# Patient Record
Sex: Female | Born: 1975
Health system: Southern US, Community
[De-identification: ages and names within clinical notes are randomized; demographics above are authoritative.]

## PROBLEM LIST (undated history)

## (undated) DIAGNOSIS — L0291 Cutaneous abscess, unspecified: Secondary | ICD-10-CM

## (undated) DIAGNOSIS — B029 Zoster without complications: Secondary | ICD-10-CM

## (undated) HISTORY — PX: CHOLECYSTECTOMY: SHX55

## (undated) HISTORY — PX: TUBAL LIGATION: SHX77

## (undated) HISTORY — PX: BUNIONECTOMY: SHX129

## (undated) SURGERY — INSERTION, INTRAMEDULLARY ROD, TIBIA
Anesthesia: Choice | Laterality: Right

---

## 2003-11-21 ENCOUNTER — Emergency Department (HOSPITAL_COMMUNITY): Admission: EM | Admit: 2003-11-21 | Discharge: 2003-11-21 | Payer: Self-pay | Admitting: Emergency Medicine

## 2008-05-25 ENCOUNTER — Emergency Department: Payer: Self-pay | Admitting: Emergency Medicine

## 2008-05-29 ENCOUNTER — Emergency Department: Payer: Self-pay | Admitting: Emergency Medicine

## 2009-05-28 ENCOUNTER — Emergency Department (HOSPITAL_COMMUNITY): Admission: EM | Admit: 2009-05-28 | Discharge: 2009-05-28 | Payer: Self-pay | Admitting: Emergency Medicine

## 2010-02-09 ENCOUNTER — Emergency Department (HOSPITAL_BASED_OUTPATIENT_CLINIC_OR_DEPARTMENT_OTHER): Admission: EM | Admit: 2010-02-09 | Discharge: 2010-02-09 | Payer: Self-pay | Admitting: Emergency Medicine

## 2010-02-09 ENCOUNTER — Ambulatory Visit: Payer: Self-pay | Admitting: Diagnostic Radiology

## 2010-03-11 ENCOUNTER — Emergency Department (HOSPITAL_BASED_OUTPATIENT_CLINIC_OR_DEPARTMENT_OTHER): Admission: EM | Admit: 2010-03-11 | Discharge: 2010-03-11 | Payer: Self-pay | Admitting: Emergency Medicine

## 2010-03-11 ENCOUNTER — Ambulatory Visit: Payer: Self-pay | Admitting: Diagnostic Radiology

## 2010-06-28 ENCOUNTER — Emergency Department (HOSPITAL_COMMUNITY)
Admission: EM | Admit: 2010-06-28 | Discharge: 2010-06-28 | Payer: Self-pay | Source: Home / Self Care | Admitting: Emergency Medicine

## 2010-09-16 ENCOUNTER — Emergency Department (HOSPITAL_COMMUNITY)
Admission: EM | Admit: 2010-09-16 | Discharge: 2010-09-16 | Disposition: A | Discharge status: Home / Self Care | Payer: Self-pay | Attending: Emergency Medicine | Admitting: Emergency Medicine

## 2010-09-16 DIAGNOSIS — R51 Headache: Secondary | ICD-10-CM | POA: Insufficient documentation

## 2010-09-16 DIAGNOSIS — J3489 Other specified disorders of nose and nasal sinuses: Secondary | ICD-10-CM | POA: Insufficient documentation

## 2010-09-16 DIAGNOSIS — R05 Cough: Secondary | ICD-10-CM | POA: Insufficient documentation

## 2010-09-16 DIAGNOSIS — J309 Allergic rhinitis, unspecified: Secondary | ICD-10-CM | POA: Insufficient documentation

## 2010-09-16 DIAGNOSIS — R0609 Other forms of dyspnea: Secondary | ICD-10-CM | POA: Insufficient documentation

## 2010-09-16 DIAGNOSIS — R0989 Other specified symptoms and signs involving the circulatory and respiratory systems: Secondary | ICD-10-CM | POA: Insufficient documentation

## 2010-09-16 DIAGNOSIS — R059 Cough, unspecified: Secondary | ICD-10-CM | POA: Insufficient documentation

## 2011-11-19 ENCOUNTER — Emergency Department (HOSPITAL_BASED_OUTPATIENT_CLINIC_OR_DEPARTMENT_OTHER): Payer: Self-pay

## 2011-11-19 ENCOUNTER — Encounter (HOSPITAL_BASED_OUTPATIENT_CLINIC_OR_DEPARTMENT_OTHER): Payer: Self-pay | Admitting: *Deleted

## 2011-11-19 ENCOUNTER — Emergency Department (HOSPITAL_BASED_OUTPATIENT_CLINIC_OR_DEPARTMENT_OTHER)
Admission: EM | Admit: 2011-11-19 | Discharge: 2011-11-19 | Disposition: A | Discharge status: Home / Self Care | Payer: Self-pay | Attending: Emergency Medicine | Admitting: Emergency Medicine

## 2011-11-19 DIAGNOSIS — R0602 Shortness of breath: Secondary | ICD-10-CM | POA: Insufficient documentation

## 2011-11-19 DIAGNOSIS — R635 Abnormal weight gain: Secondary | ICD-10-CM | POA: Insufficient documentation

## 2011-11-19 DIAGNOSIS — R2240 Localized swelling, mass and lump, unspecified lower limb: Secondary | ICD-10-CM

## 2011-11-19 DIAGNOSIS — R229 Localized swelling, mass and lump, unspecified: Secondary | ICD-10-CM | POA: Insufficient documentation

## 2011-11-19 LAB — URINALYSIS, ROUTINE W REFLEX MICROSCOPIC
Bilirubin Urine: NEGATIVE
Glucose, UA: NEGATIVE mg/dL
Leukocytes, UA: NEGATIVE
Nitrite: NEGATIVE
Protein, ur: NEGATIVE mg/dL
Urobilinogen, UA: 0.2 mg/dL (ref 0.0–1.0)
pH: 5.5 (ref 5.0–8.0)

## 2011-11-19 LAB — CBC
HCT: 38.1 % (ref 36.0–46.0)
MCH: 30.3 pg (ref 26.0–34.0)
MCHC: 34.4 g/dL (ref 30.0–36.0)
MCV: 88 fL (ref 78.0–100.0)
Platelets: 380 10*3/uL (ref 150–400)
RBC: 4.33 MIL/uL (ref 3.87–5.11)
RDW: 12.9 % (ref 11.5–15.5)
WBC: 13.5 10*3/uL — ABNORMAL HIGH (ref 4.0–10.5)

## 2011-11-19 LAB — COMPREHENSIVE METABOLIC PANEL
Calcium: 9.5 mg/dL (ref 8.4–10.5)
Creatinine, Ser: 0.8 mg/dL (ref 0.50–1.10)
GFR calc non Af Amer: >90 mL/min (ref >90)
Glucose, Bld: 108 mg/dL — ABNORMAL HIGH (ref 70–99)
Total Bilirubin: 0.2 mg/dL — ABNORMAL LOW (ref 0.3–1.2)

## 2011-11-19 LAB — URINE MICROSCOPIC-ADD ON

## 2011-11-19 LAB — PREGNANCY, URINE: Preg Test, Ur: NEGATIVE

## 2011-11-19 NOTE — ED Notes (Signed)
Pt c/o bil lower leg swelling.

## 2011-11-19 NOTE — ED Provider Notes (Signed)
History     CSN: [id number]  Arrival date & time 11/19/11  1525   First MD Initiated Contact with Patient 11/19/11 1535      Chief Complaint  Patient presents with  . Leg Swelling    (Consider location/radiation/quality/duration/timing/severity/associated sxs/prior treatment) HPI Comments: Pt states that she has had bilateral lower leg swelling time a couple of weeks:pt states that the swelling come and goes:pt states that the left is worse then the right:pt c/o left foot pain without known injury:pt states that she may have some intermittent sob:pt denies any medical problems:pt states that she has had a 12lb wt gain in the last month  The history is provided by the patient. No language interpreter was used.    History reviewed. No pertinent past medical history.  Past Surgical History  Procedure Date  . Tubal ligation     History reviewed. No pertinent family history.  History  Substance Use Topics  . Smoking status: Never Smoker   . Smokeless tobacco: Not on file  . Alcohol Use: No    OB History    Grav Para Term Preterm Abortions TAB SAB Ect Mult Living                  Review of Systems  Constitutional: Negative.   Respiratory: Positive for shortness of breath.   Cardiovascular: Negative.   Neurological: Negative.     Allergies  Review of patient's allergies indicates no known allergies.  Home Medications  No current outpatient prescriptions on file.  BP 141/91  Pulse 78  Temp(Src) 98.8 F (37.1 C) (Oral)  Resp 16  Ht 5\' 5"  (1.651 m)  Wt 234 lb (106.142 kg)  BMI 38.94 kg/m2  SpO2 100%  LMP 11/19/2011  Physical Exam  Nursing note and vitals reviewed. Constitutional: She is oriented to person, place, and time. She appears well-developed and well-nourished.  HENT:  Head: Normocephalic and atraumatic.  Eyes: Conjunctivae are normal.  Cardiovascular: Normal rate and regular rhythm.   Pulmonary/Chest: Effort normal and breath sounds normal.    Abdominal: Soft. Bowel sounds are normal. There is no tenderness.  Musculoskeletal: Normal range of motion. She exhibits no edema.  Neurological: She is alert and oriented to person, place, and time. She exhibits normal muscle tone. Coordination normal.  Skin: Skin is warm and dry.    ED Course  Procedures (including critical care time)  Labs Reviewed  COMPREHENSIVE METABOLIC PANEL - Abnormal; Notable for the following:    Glucose, Bld 108 (*)    Total Bilirubin 0.2 (*)    All other components within normal limits  CBC - Abnormal; Notable for the following:    WBC 13.5 (*)    All other components within normal limits  URINALYSIS, ROUTINE W REFLEX MICROSCOPIC - Abnormal; Notable for the following:    Hgb urine dipstick TRACE (*)    All other components within normal limits  PREGNANCY, URINE  URINE MICROSCOPIC-ADD ON   Dg Chest 2 View  11/19/2011  *RADIOLOGY REPORT*  Clinical Data: Bilateral foot swelling.  Mild shortness of breath.  CHEST - 2 VIEW  Comparison: None.  Findings: Lungs are clear.  Heart size is normal.  No pneumothorax or effusion.  IMPRESSION: No acute finding.  Original Report Authenticated By: Bernadene Bell. D'ALESSIO, M.D.   Dg Foot Complete Left  11/19/2011  *RADIOLOGY REPORT*  Clinical Data: Swelling, pain  LEFT FOOT - COMPLETE 3+ VIEW  Comparison: None.  Findings: Normal alignment without evidence of acute fracture.  Preserved joint spaces.  No significant arthropathy.  No radiographic swelling or foreign body.  IMPRESSION: No acute finding.  Original Report Authenticated By: Judie Petit. Ruel Favors, M.D.     1. Weight gain   2. Localized swelling, mass, or lump of lower extremity       MDM  No edema noted at this time:pt requesting lasix:discussed with pt that that is not appropriate treatment at this time:discussed pt being rechecked with the pt        Teressa Lower, NP 11/19/11 1740

## 2011-11-19 NOTE — ED Provider Notes (Signed)
Medical screening examination/treatment/procedure(s) were performed by non-physician practitioner and as supervising physician I was immediately available for consultation/collaboration.    Nelia Shi, MD 11/19/11 [phone]

## 2012-03-30 ENCOUNTER — Encounter (HOSPITAL_BASED_OUTPATIENT_CLINIC_OR_DEPARTMENT_OTHER): Payer: Self-pay | Admitting: Emergency Medicine

## 2012-03-30 ENCOUNTER — Emergency Department (HOSPITAL_BASED_OUTPATIENT_CLINIC_OR_DEPARTMENT_OTHER)
Admission: EM | Admit: 2012-03-30 | Discharge: 2012-03-30 | Disposition: A | Discharge status: Home / Self Care | Payer: Self-pay | Attending: Emergency Medicine | Admitting: Emergency Medicine

## 2012-03-30 DIAGNOSIS — B029 Zoster without complications: Secondary | ICD-10-CM | POA: Insufficient documentation

## 2012-03-30 MED ORDER — ACYCLOVIR 400 MG PO TABS: 800.0000 mg | ORAL_TABLET | Freq: Every day | ORAL | Status: DC

## 2012-03-30 MED ORDER — OXYCODONE-ACETAMINOPHEN 5-325 MG PO TABS: 1.0000 | ORAL_TABLET | Freq: Four times a day (QID) | ORAL | Status: DC | PRN

## 2012-03-30 MED ORDER — PREDNISONE 10 MG PO TABS: 20.0000 mg | ORAL_TABLET | Freq: Two times a day (BID) | ORAL | Status: DC

## 2012-03-30 NOTE — ED Notes (Signed)
Painful rash to left lateral breast.  Has gotten worse with pain and itching.

## 2012-03-30 NOTE — ED Provider Notes (Signed)
History  This chart was scribed for [name] Lyons, MD by Erskine Emery. This patient was seen in room MH12/MH12 and the patient's care was started at 22:19.   CSN: [id number]  Arrival date & time 03/30/12  2156   First MD Initiated Contact with Patient 03/30/12 2219      Chief Complaint  Patient presents with  . Rash    (Consider location/radiation/quality/duration/timing/severity/associated sxs/prior treatment) Patient is a 36 y.o. female presenting with rash. The history is provided by the patient. No language interpreter was used.  Rash  The problem is associated with nothing. There has been no fever. Affected Location: left chest. The pain is moderate. The pain has been constant since onset. Associated symptoms include blisters and pain. She has tried nothing for the symptoms.  Mandie Crabbe is a 36 y.o. female who presents to the Emergency Department complaining of a painful rash to the left lateral breast since Friday night. Pt reports Vicodin makes her sick; percocet is the only pain medication she knows she tolerates well.  History reviewed. No pertinent past medical history.  Past Surgical History  Procedure Date  . Tubal ligation     No family history on file.  History  Substance Use Topics  . Smoking status: Never Smoker   . Smokeless tobacco: Not on file  . Alcohol Use: No    OB History    Grav Para Term Preterm Abortions TAB SAB Ect Mult Living                  Review of Systems  Constitutional: Negative for fever and chills.  Respiratory: Negative for shortness of breath.   Gastrointestinal: Negative for nausea and vomiting.  Skin: Positive for rash.  Neurological: Negative for weakness.    Allergies  Review of patient's allergies indicates no known allergies.  Home Medications   Current Outpatient Rx  Name Route Sig Dispense Refill  . BC HEADACHE POWDER PO Oral Take 1 packet by mouth daily as needed. Patient used this medication for her back  pain.      Triage Virals: BP 127/85  Pulse 82  Temp 98.4 F (36.9 C) (Oral)  Resp 16  Ht 5\' 5"  (1.651 m)  Wt 230 lb (104.327 kg)  BMI 38.27 kg/m2  SpO2 100%  LMP 02/27/2012  Physical Exam  Nursing note and vitals reviewed. Constitutional: She is oriented to person, place, and time. She appears well-developed and well-nourished. No distress.  HENT:  Head: Normocephalic and atraumatic.  Eyes: EOM are normal.  Neck: Neck supple. No tracheal deviation present.  Cardiovascular: Normal rate.   Pulmonary/Chest: Effort normal. No respiratory distress.  Abdominal: Soft. She exhibits no distension.  Musculoskeletal: Normal range of motion. She exhibits no edema.  Neurological: She is alert and oriented to person, place, and time.  Skin: Skin is warm and dry.       Just below the left axilla: a vesicular rash in a dermatomal pattern.  Psychiatric: She has a normal mood and affect.    ED Course  Procedures (including critical care time) DIAGNOSTIC STUDIES: Oxygen Saturation is 100% on room air, normal by my interpretation.    COORDINATION OF CARE: 22:25--I evaluated the patient and we discussed a treatment plan including acyclovir, prednisone, and percocet to which the pt agreed. I told the pt to stay away from anyone who is immunocompromised or anyone pregnant and to keep the area covered. I told the pt it may be about a week-2 weeks before the  area heals over.   Labs Reviewed - No data to display No results found.   No diagnosis found.    MDM  This is a shingles, will treat with acyclovir, prednisone, and hydrocodone.  To return prn.      I personally performed the services described in this documentation, which was scribed in my presence. The recorded information has been reviewed and considered.     [name] Lyons, MD 04/01/12 [phone]
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
## 2012-08-01 ENCOUNTER — Encounter (HOSPITAL_BASED_OUTPATIENT_CLINIC_OR_DEPARTMENT_OTHER): Payer: Self-pay

## 2012-08-01 ENCOUNTER — Emergency Department (HOSPITAL_BASED_OUTPATIENT_CLINIC_OR_DEPARTMENT_OTHER)
Admission: EM | Admit: 2012-08-01 | Discharge: 2012-08-01 | Disposition: A | Discharge status: Home / Self Care | Payer: Self-pay | Attending: Emergency Medicine | Admitting: Emergency Medicine

## 2012-08-01 ENCOUNTER — Emergency Department (HOSPITAL_BASED_OUTPATIENT_CLINIC_OR_DEPARTMENT_OTHER): Payer: Self-pay

## 2012-08-01 DIAGNOSIS — R0602 Shortness of breath: Secondary | ICD-10-CM | POA: Insufficient documentation

## 2012-08-01 DIAGNOSIS — J189 Pneumonia, unspecified organism: Secondary | ICD-10-CM

## 2012-08-01 DIAGNOSIS — Z7982 Long term (current) use of aspirin: Secondary | ICD-10-CM | POA: Insufficient documentation

## 2012-08-01 DIAGNOSIS — Z792 Long term (current) use of antibiotics: Secondary | ICD-10-CM | POA: Insufficient documentation

## 2012-08-01 DIAGNOSIS — Z79899 Other long term (current) drug therapy: Secondary | ICD-10-CM | POA: Insufficient documentation

## 2012-08-01 DIAGNOSIS — R498 Other voice and resonance disorders: Secondary | ICD-10-CM | POA: Insufficient documentation

## 2012-08-01 DIAGNOSIS — B029 Zoster without complications: Secondary | ICD-10-CM | POA: Insufficient documentation

## 2012-08-01 DIAGNOSIS — J3489 Other specified disorders of nose and nasal sinuses: Secondary | ICD-10-CM | POA: Insufficient documentation

## 2012-08-01 DIAGNOSIS — R0982 Postnasal drip: Secondary | ICD-10-CM | POA: Insufficient documentation

## 2012-08-01 DIAGNOSIS — J159 Unspecified bacterial pneumonia: Secondary | ICD-10-CM | POA: Insufficient documentation

## 2012-08-01 HISTORY — DX: Zoster without complications: B02.9

## 2012-08-01 MED ORDER — AZITHROMYCIN 250 MG PO TABS: 250.0000 mg | ORAL_TABLET | Freq: Every day | ORAL | Status: DC

## 2012-08-01 MED ORDER — ALBUTEROL SULFATE (5 MG/ML) 0.5% IN NEBU
5.0000 mg | INHALATION_SOLUTION | Freq: Once | RESPIRATORY_TRACT | Status: AC
  Administered 2012-08-01: 5 mg via RESPIRATORY_TRACT
  Filled 2012-08-01: qty 1

## 2012-08-01 MED ORDER — AZITHROMYCIN 250 MG PO TABS
500.0000 mg | ORAL_TABLET | Freq: Once | ORAL | Status: AC
  Administered 2012-08-01: 500 mg via ORAL
  Filled 2012-08-01: qty 2

## 2012-08-01 NOTE — ED Provider Notes (Signed)
History  This chart was scribed for Charles B. Bernette Mayers, MD by Shari Heritage, ED Scribe. The patient was seen in room MH10/MH10. Patient's care was started at 1905.   CSN: [id number]  Arrival date & time 08/01/12  1734   First MD Initiated Contact with Patient 08/01/12 1905      Chief Complaint  Patient presents with  . Cough  . Shortness of Breath    The history is provided by the patient. No language interpreter was used.    HPI Comments: [name] [name] is a 37 y.o. female who presents to the Emergency Department complaining moderate shortness of breath onset 2.5 hours ago and cough that began a few days ago. Patient states that cough is sometimes productive of green sputum. There is associated nasal congestion, postnasal drip and voice hoarseness. Patient states that hoarseness also began today when she arrived in the ED. Patient denies fever or sore throat. Patient states that a couple years ago, she had similar shortness of breath and voice hoarseness and was given a breathing treatment at [facility] which improved symptoms significantly. Patient has a medical history of shingles and surgical history of tubal ligation.  Past Medical History  Diagnosis Date  . Shingles     Past Surgical History  Procedure Laterality Date  . Tubal ligation      History reviewed. No pertinent family history.  History  Substance Use Topics  . Smoking status: Passive Smoke Exposure - Never Smoker  . Smokeless tobacco: Never Used  . Alcohol Use: Yes     Comment: occasional use    OB History   Grav Para Term Preterm Abortions TAB SAB Ect Mult Living                  Review of Systems A complete 10 system review of systems was obtained and all systems are negative except as noted in the HPI and PMH.   Allergies  Review of patient's allergies indicates no known allergies.  Home Medications   Current Outpatient Rx  Name  Route  Sig  Dispense  Refill  . Aspirin-Salicylamide-Caffeine (BC  HEADACHE POWDER PO)   Oral   Take 1 packet by mouth daily as needed. Patient used this medication for her back pain.         Marland Kitchen acyclovir (ZOVIRAX) 400 MG tablet   Oral   Take 2 tablets (800 mg total) by mouth 5 (five) times daily.   100 tablet   0   . oxyCODONE-acetaminophen (PERCOCET/ROXICET) 5-325 MG per tablet   Oral   Take 1-2 tablets by mouth every 6 (six) hours as needed for pain.   20 tablet   0   . predniSONE (DELTASONE) 10 MG tablet   Oral   Take 2 tablets (20 mg total) by mouth 2 (two) times daily.   20 tablet   0     Triage Vitals: BP 135/85  Pulse 87  Temp(Src) 98.1 F (36.7 C) (Oral)  Resp 18  Ht 5\' 5"  (1.651 m)  Wt 230 lb (104.327 kg)  BMI 38.27 kg/m2  SpO2 100%  LMP 07/01/2012  Physical Exam  Nursing note and vitals reviewed. Constitutional: She is oriented to person, place, and time. She appears well-developed and well-nourished.  HENT:  Head: Normocephalic and atraumatic.  Hoarse voice.  Eyes: EOM are normal. Pupils are equal, round, and reactive to light.  Neck: Normal range of motion. Neck supple.  Cardiovascular: Normal rate, normal heart sounds and intact distal pulses.  Pulmonary/Chest: Effort normal. No stridor. She has wheezes (Faint expiratory wheeze.).  Abdominal: Bowel sounds are normal. She exhibits no distension. There is no tenderness.  Musculoskeletal: Normal range of motion. She exhibits no edema and no tenderness.  Neurological: She is alert and oriented to person, place, and time. She has normal strength. No cranial nerve deficit or sensory deficit.  Skin: Skin is warm and dry. No rash noted.  Psychiatric: She has a normal mood and affect.    ED Course  Procedures (including critical care time) DIAGNOSTIC STUDIES: Oxygen Saturation is 100% on room air, normal by my interpretation.    COORDINATION OF CARE: 7:06 PM- Patient informed of current plan for treatment and evaluation and agrees with plan at this time.    Labs  Reviewed - No data to display   Dg Chest 2 View  08/01/2012  *RADIOLOGY REPORT*  Clinical Data: Cough  CHEST - 2 VIEW  Comparison: 11/19/2011  Findings: Heart size is normal.  No pleural effusion identified. There is marked asymmetric elevation of the right hemidiaphragm. Overlying airspace consolidation is noted.  Left lung appears clear.  Review of the visualized osseous structures is unremarkable.  IMPRESSION:  1.  Right lower lobe airspace consolidation consistent with pneumonia. 2.  Marked asymmetric elevation of the right hemidiaphragm.   Original Report Authenticated By: Signa Kell, M.D.      No diagnosis found.    MDM  Hoarse voice resolved with Neb treatment. She is feeling better. CXR as above, concerning for developing pneumonia. She is afebrile without hypoxia here. Will treat with Z-pak. PCP follow up for resolution.      I personally performed the services described in this documentation, which was scribed in my presence. The recorded information has been reviewed and is accurate.     Charles B. Bernette Mayers, MD 08/01/12 [phone]

## 2012-08-01 NOTE — ED Notes (Signed)
Patient transported to X-ray 

## 2012-08-01 NOTE — ED Notes (Signed)
Pt states that she has had cough for a few days, productive for green sputum.  Pt states that she has sob, and hoarseness of voice for the past few hours.  Pt is anxious and has tachypnea at triage.

## 2012-12-31 ENCOUNTER — Encounter (HOSPITAL_BASED_OUTPATIENT_CLINIC_OR_DEPARTMENT_OTHER): Payer: Self-pay

## 2012-12-31 ENCOUNTER — Emergency Department (HOSPITAL_BASED_OUTPATIENT_CLINIC_OR_DEPARTMENT_OTHER)
Admission: EM | Admit: 2012-12-31 | Discharge: 2012-12-31 | Disposition: A | Discharge status: Home / Self Care | Payer: Self-pay | Attending: Emergency Medicine | Admitting: Emergency Medicine

## 2012-12-31 DIAGNOSIS — Z8619 Personal history of other infectious and parasitic diseases: Secondary | ICD-10-CM | POA: Insufficient documentation

## 2012-12-31 DIAGNOSIS — N760 Acute vaginitis: Secondary | ICD-10-CM | POA: Insufficient documentation

## 2012-12-31 DIAGNOSIS — Z3202 Encounter for pregnancy test, result negative: Secondary | ICD-10-CM | POA: Insufficient documentation

## 2012-12-31 LAB — GC/CHLAMYDIA PROBE AMP: CT Probe RNA: NEGATIVE

## 2012-12-31 LAB — WET PREP, GENITAL

## 2012-12-31 LAB — URINALYSIS, ROUTINE W REFLEX MICROSCOPIC
Bilirubin Urine: NEGATIVE
Urobilinogen, UA: 0.2 mg/dL (ref 0.0–1.0)

## 2012-12-31 LAB — URINE MICROSCOPIC-ADD ON

## 2012-12-31 MED ORDER — METRONIDAZOLE 500 MG PO TABS
500.0000 mg | ORAL_TABLET | Freq: Once | ORAL | Status: AC
  Administered 2012-12-31: 500 mg via ORAL
  Filled 2012-12-31: qty 1

## 2012-12-31 MED ORDER — FLUCONAZOLE 50 MG PO TABS
ORAL_TABLET | ORAL | Status: AC
  Filled 2012-12-31: qty 1

## 2012-12-31 MED ORDER — METRONIDAZOLE 500 MG PO TABS: 500.0000 mg | ORAL_TABLET | Freq: Two times a day (BID) | ORAL | Status: DC

## 2012-12-31 MED ORDER — FLUCONAZOLE 50 MG PO TABS
150.0000 mg | ORAL_TABLET | Freq: Every day | ORAL | Status: DC
  Administered 2012-12-31: 150 mg via ORAL

## 2012-12-31 MED ORDER — FLUCONAZOLE 100 MG PO TABS
ORAL_TABLET | ORAL | Status: AC
  Filled 2012-12-31: qty 1
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
## 2012-12-31 NOTE — ED Notes (Signed)
Pt complains of yeast infection symptoms that started late Monday night early Tuesday morning.  Pt reports being allergic to OTC medications.

## 2013-01-01 LAB — URINE CULTURE: Colony Count: 50000

## 2013-07-13 ENCOUNTER — Encounter (HOSPITAL_COMMUNITY): Payer: Self-pay | Admitting: Emergency Medicine

## 2013-07-13 ENCOUNTER — Emergency Department (HOSPITAL_COMMUNITY): Payer: Medicaid Other | Admitting: Certified Registered Nurse Anesthetist

## 2013-07-13 ENCOUNTER — Emergency Department (HOSPITAL_COMMUNITY): Payer: Medicaid Other

## 2013-07-13 ENCOUNTER — Observation Stay (HOSPITAL_COMMUNITY)
Admission: EM | Admit: 2013-07-13 | Discharge: 2013-07-14 | Disposition: A | Discharge status: Home / Self Care | Payer: Medicaid Other | Attending: Emergency Medicine | Admitting: Emergency Medicine

## 2013-07-13 ENCOUNTER — Encounter (HOSPITAL_COMMUNITY): Payer: Medicaid Other | Admitting: Certified Registered Nurse Anesthetist

## 2013-07-13 ENCOUNTER — Encounter (HOSPITAL_COMMUNITY)
Admission: EM | Disposition: A | Discharge status: Home / Self Care | Payer: Self-pay | Source: Home / Self Care | Attending: Emergency Medicine

## 2013-07-13 DIAGNOSIS — S82209A Unspecified fracture of shaft of unspecified tibia, initial encounter for closed fracture: Principal | ICD-10-CM | POA: Insufficient documentation

## 2013-07-13 DIAGNOSIS — S82401A Unspecified fracture of shaft of right fibula, initial encounter for closed fracture: Secondary | ICD-10-CM

## 2013-07-13 DIAGNOSIS — K219 Gastro-esophageal reflux disease without esophagitis: Secondary | ICD-10-CM | POA: Insufficient documentation

## 2013-07-13 DIAGNOSIS — S82201A Unspecified fracture of shaft of right tibia, initial encounter for closed fracture: Secondary | ICD-10-CM | POA: Diagnosis present

## 2013-07-13 DIAGNOSIS — S82409A Unspecified fracture of shaft of unspecified fibula, initial encounter for closed fracture: Principal | ICD-10-CM

## 2013-07-13 HISTORY — PX: TIBIA IM NAIL INSERTION: SHX2516

## 2013-07-13 HISTORY — PX: ORIF TIBIA FRACTURE: SHX5416

## 2013-07-13 LAB — CBC WITH DIFFERENTIAL/PLATELET
BASOS ABS: 0.2 10*3/uL — AB (ref 0.0–0.1)
BASOS PCT: 1 % (ref 0–1)
Eosinophils Absolute: 0.4 10*3/uL (ref 0.0–0.7)
Eosinophils Relative: 2 % (ref 0–5)
HCT: 40 % (ref 36.0–46.0)
Hemoglobin: 13.4 g/dL (ref 12.0–15.0)
LYMPHS PCT: 28 % (ref 12–46)
Lymphs Abs: 4 10*3/uL (ref 0.7–4.0)
MCH: 30.5 pg (ref 26.0–34.0)
MCHC: 33.5 g/dL (ref 30.0–36.0)
MCV: 90.9 fL (ref 78.0–100.0)
Monocytes Absolute: 0.9 10*3/uL (ref 0.1–1.0)
Monocytes Relative: 6 % (ref 3–12)
NEUTROS ABS: 9.1 10*3/uL — AB (ref 1.7–7.7)
NEUTROS PCT: 63 % (ref 43–77)
Platelets: 316 10*3/uL (ref 150–400)
RBC: 4.4 MIL/uL (ref 3.87–5.11)
RDW: 13.3 % (ref 11.5–15.5)
WBC: 14.5 10*3/uL — ABNORMAL HIGH (ref 4.0–10.5)

## 2013-07-13 SURGERY — INSERTION, INTRAMEDULLARY ROD, TIBIA
Anesthesia: General | Site: Leg Lower | Laterality: Right

## 2013-07-13 MED ORDER — HYDROMORPHONE HCL PF 1 MG/ML IJ SOLN
1.0000 mg | INTRAMUSCULAR | Status: DC | PRN
Start: 2013-07-13 — End: 2013-07-13
  Filled 2013-07-13: qty 1

## 2013-07-13 MED ORDER — ONDANSETRON HCL 4 MG PO TABS: 4.0000 mg | ORAL_TABLET | Freq: Four times a day (QID) | ORAL | Status: DC | PRN

## 2013-07-13 MED ORDER — MIDAZOLAM HCL 2 MG/2ML IJ SOLN
INTRAMUSCULAR | Status: AC
  Filled 2013-07-13: qty 2

## 2013-07-13 MED ORDER — CEFAZOLIN SODIUM-DEXTROSE 2-3 GM-% IV SOLR
INTRAVENOUS | Status: DC | PRN
  Administered 2013-07-13: 2 g via INTRAVENOUS

## 2013-07-13 MED ORDER — PROPOFOL 10 MG/ML IV BOLUS
INTRAVENOUS | Status: DC | PRN
Start: 2013-07-13 — End: 2013-07-13
  Administered 2013-07-13: 170 mg via INTRAVENOUS

## 2013-07-13 MED ORDER — DOCUSATE SODIUM 100 MG PO CAPS
100.0000 mg | ORAL_CAPSULE | Freq: Two times a day (BID) | ORAL | Status: DC
  Administered 2013-07-14: 100 mg via ORAL
  Filled 2013-07-13 (×2): qty 1

## 2013-07-13 MED ORDER — ASPIRIN EC 325 MG PO TBEC
325.0000 mg | DELAYED_RELEASE_TABLET | Freq: Two times a day (BID) | ORAL | Status: DC
  Administered 2013-07-14: 325 mg via ORAL
  Filled 2013-07-13 (×3): qty 1

## 2013-07-13 MED ORDER — ONDANSETRON HCL 4 MG/2ML IJ SOLN
INTRAMUSCULAR | Status: AC
  Filled 2013-07-13: qty 2

## 2013-07-13 MED ORDER — MIDAZOLAM HCL 5 MG/5ML IJ SOLN
INTRAMUSCULAR | Status: DC | PRN
  Administered 2013-07-13 (×2): 1 mg via INTRAVENOUS

## 2013-07-13 MED ORDER — HYDROMORPHONE HCL PF 1 MG/ML IJ SOLN
INTRAMUSCULAR | Status: AC
  Filled 2013-07-13: qty 1

## 2013-07-13 MED ORDER — ONDANSETRON HCL 4 MG/2ML IJ SOLN
INTRAMUSCULAR | Status: DC | PRN
Start: 2013-07-13 — End: 2013-07-13
  Administered 2013-07-13: 4 mg via INTRAVENOUS

## 2013-07-13 MED ORDER — LIDOCAINE HCL (CARDIAC) 20 MG/ML IV SOLN
INTRAVENOUS | Status: AC
  Filled 2013-07-13: qty 5

## 2013-07-13 MED ORDER — SUCCINYLCHOLINE CHLORIDE 20 MG/ML IJ SOLN
INTRAMUSCULAR | Status: AC
  Filled 2013-07-13: qty 1

## 2013-07-13 MED ORDER — DEXAMETHASONE SODIUM PHOSPHATE 4 MG/ML IJ SOLN
INTRAMUSCULAR | Status: DC | PRN
  Administered 2013-07-13: 4 mg via INTRAVENOUS

## 2013-07-13 MED ORDER — GLYCOPYRROLATE 0.2 MG/ML IJ SOLN
INTRAMUSCULAR | Status: DC | PRN
  Administered 2013-07-13: 0.4 mg via INTRAVENOUS

## 2013-07-13 MED ORDER — SUCCINYLCHOLINE CHLORIDE 20 MG/ML IJ SOLN
INTRAMUSCULAR | Status: DC | PRN
  Administered 2013-07-13: 100 mg via INTRAVENOUS

## 2013-07-13 MED ORDER — 0.9 % SODIUM CHLORIDE (POUR BTL) OPTIME
TOPICAL | Status: DC | PRN
  Administered 2013-07-13: 1000 mL

## 2013-07-13 MED ORDER — FENTANYL CITRATE 0.05 MG/ML IJ SOLN
INTRAMUSCULAR | Status: DC | PRN
  Administered 2013-07-13 (×6): 50 ug via INTRAVENOUS

## 2013-07-13 MED ORDER — POLYETHYLENE GLYCOL 3350 17 G PO PACK: 17.0000 g | PACK | Freq: Every day | ORAL | Status: DC | PRN

## 2013-07-13 MED ORDER — OXYCODONE-ACETAMINOPHEN 5-325 MG PO TABS
ORAL_TABLET | ORAL | Status: AC
  Filled 2013-07-13: qty 1

## 2013-07-13 MED ORDER — ROCURONIUM BROMIDE 50 MG/5ML IV SOLN
INTRAVENOUS | Status: AC
  Filled 2013-07-13: qty 1

## 2013-07-13 MED ORDER — BISACODYL 10 MG RE SUPP: 10.0000 mg | Freq: Every day | RECTAL | Status: DC | PRN

## 2013-07-13 MED ORDER — PROPOFOL 10 MG/ML IV BOLUS
INTRAVENOUS | Status: AC
  Filled 2013-07-13: qty 20

## 2013-07-13 MED ORDER — OXYCODONE-ACETAMINOPHEN 5-325 MG PO TABS
1.0000 | ORAL_TABLET | ORAL | Status: DC | PRN
  Administered 2013-07-13 – 2013-07-14 (×5): 2 via ORAL
  Filled 2013-07-13 (×5): qty 2

## 2013-07-13 MED ORDER — HYDROMORPHONE HCL PF 1 MG/ML IJ SOLN
1.0000 mg | Freq: Once | INTRAMUSCULAR | Status: AC
  Administered 2013-07-13: 1 mg via INTRAVENOUS
  Filled 2013-07-13: qty 1

## 2013-07-13 MED ORDER — CEFAZOLIN SODIUM-DEXTROSE 2-3 GM-% IV SOLR
2.0000 g | Freq: Four times a day (QID) | INTRAVENOUS | Status: AC
  Administered 2013-07-14 (×3): 2 g via INTRAVENOUS
  Filled 2013-07-13 (×3): qty 50

## 2013-07-13 MED ORDER — METHOCARBAMOL 500 MG PO TABS
500.0000 mg | ORAL_TABLET | Freq: Four times a day (QID) | ORAL | Status: DC | PRN
  Administered 2013-07-13 – 2013-07-14 (×4): 500 mg via ORAL
  Filled 2013-07-13 (×3): qty 1

## 2013-07-13 MED ORDER — METHOCARBAMOL 500 MG PO TABS
ORAL_TABLET | ORAL | Status: AC
  Filled 2013-07-13: qty 1

## 2013-07-13 MED ORDER — SODIUM CHLORIDE 0.9 % IV SOLN
INTRAVENOUS | Status: DC
  Administered 2013-07-13: 22:00:00 via INTRAVENOUS

## 2013-07-13 MED ORDER — CHLORHEXIDINE GLUCONATE CLOTH 2 % EX PADS: 6.0000 | MEDICATED_PAD | Freq: Once | CUTANEOUS | Status: DC

## 2013-07-13 MED ORDER — LACTATED RINGERS IV SOLN
INTRAVENOUS | Status: DC | PRN
  Administered 2013-07-13 (×2): via INTRAVENOUS

## 2013-07-13 MED ORDER — OXYCODONE-ACETAMINOPHEN 5-325 MG PO TABS
ORAL_TABLET | ORAL | Status: AC
  Filled 2013-07-13: qty 2

## 2013-07-13 MED ORDER — CHLORHEXIDINE GLUCONATE 4 % EX LIQD: 60.0000 mL | Freq: Once | CUTANEOUS | Status: DC

## 2013-07-13 MED ORDER — CEFAZOLIN SODIUM-DEXTROSE 2-3 GM-% IV SOLR
INTRAVENOUS | Status: AC
Start: 2013-07-13 — End: 2013-07-13
  Filled 2013-07-13: qty 50

## 2013-07-13 MED ORDER — ROCURONIUM BROMIDE 100 MG/10ML IV SOLN
INTRAVENOUS | Status: DC | PRN
  Administered 2013-07-13: 10 mg via INTRAVENOUS
  Administered 2013-07-13: 30 mg via INTRAVENOUS

## 2013-07-13 MED ORDER — PROMETHAZINE HCL 25 MG/ML IJ SOLN
12.5000 mg | Freq: Four times a day (QID) | INTRAMUSCULAR | Status: DC | PRN
  Administered 2013-07-14: 12.5 mg via INTRAVENOUS
  Filled 2013-07-13 (×2): qty 1

## 2013-07-13 MED ORDER — IOHEXOL 300 MG/ML  SOLN
100.0000 mL | Freq: Once | INTRAMUSCULAR | Status: AC | PRN
  Administered 2013-07-13: 100 mL via INTRAVENOUS

## 2013-07-13 MED ORDER — GLYCOPYRROLATE 0.2 MG/ML IJ SOLN
INTRAMUSCULAR | Status: AC
  Filled 2013-07-13: qty 3

## 2013-07-13 MED ORDER — LACTATED RINGERS IV SOLN
INTRAVENOUS | Status: DC
Start: 2013-07-13 — End: 2013-07-13

## 2013-07-13 MED ORDER — HYDROMORPHONE HCL PF 1 MG/ML IJ SOLN
0.2500 mg | INTRAMUSCULAR | Status: DC | PRN
  Administered 2013-07-13 (×4): 0.5 mg via INTRAVENOUS

## 2013-07-13 MED ORDER — LIDOCAINE HCL (CARDIAC) 20 MG/ML IV SOLN
INTRAVENOUS | Status: DC | PRN
  Administered 2013-07-13: 60 mg via INTRAVENOUS

## 2013-07-13 MED ORDER — HYDROMORPHONE HCL PF 1 MG/ML IJ SOLN
1.0000 mg | INTRAMUSCULAR | Status: DC | PRN
  Administered 2013-07-13 – 2013-07-14 (×3): 1 mg via INTRAVENOUS
  Filled 2013-07-13 (×3): qty 1

## 2013-07-13 MED ORDER — NEOSTIGMINE METHYLSULFATE 1 MG/ML IJ SOLN
INTRAMUSCULAR | Status: DC | PRN
  Administered 2013-07-13: 3 mg via INTRAVENOUS

## 2013-07-13 MED ORDER — LACTATED RINGERS IV SOLN
INTRAVENOUS | Status: DC | PRN
  Administered 2013-07-13: 18:00:00 via INTRAVENOUS

## 2013-07-13 MED ORDER — CEFAZOLIN SODIUM-DEXTROSE 2-3 GM-% IV SOLR: 2.0000 g | INTRAVENOUS | Status: DC

## 2013-07-13 MED ORDER — SODIUM CHLORIDE 0.9 % IV SOLN
Freq: Once | INTRAVENOUS | Status: AC
  Administered 2013-07-13: 13:00:00 via INTRAVENOUS

## 2013-07-13 MED ORDER — FENTANYL CITRATE 0.05 MG/ML IJ SOLN
INTRAMUSCULAR | Status: AC
  Filled 2013-07-13: qty 5

## 2013-07-13 MED ORDER — DEXTROSE 5 % IV SOLN
500.0000 mg | Freq: Four times a day (QID) | INTRAVENOUS | Status: DC | PRN
  Filled 2013-07-13: qty 5

## 2013-07-13 MED ORDER — HYDROMORPHONE HCL PF 1 MG/ML IJ SOLN
1.0000 mg | INTRAMUSCULAR | Status: AC | PRN
  Administered 2013-07-13 (×3): 1 mg via INTRAVENOUS
  Filled 2013-07-13 (×3): qty 1

## 2013-07-13 MED ORDER — DEXAMETHASONE SODIUM PHOSPHATE 4 MG/ML IJ SOLN
INTRAMUSCULAR | Status: AC
  Filled 2013-07-13: qty 1

## 2013-07-13 MED ORDER — ONDANSETRON HCL 4 MG/2ML IJ SOLN: 4.0000 mg | Freq: Four times a day (QID) | INTRAMUSCULAR | Status: DC | PRN

## 2013-07-13 MED ORDER — ONDANSETRON HCL 4 MG/2ML IJ SOLN
4.0000 mg | Freq: Once | INTRAMUSCULAR | Status: AC | PRN
  Administered 2013-07-13: 4 mg via INTRAVENOUS

## 2013-07-13 MED ORDER — KETOROLAC TROMETHAMINE 30 MG/ML IJ SOLN
15.0000 mg | Freq: Four times a day (QID) | INTRAMUSCULAR | Status: DC
Start: 2013-07-14 — End: 2013-07-14
  Administered 2013-07-14 (×3): 15 mg via INTRAVENOUS
  Filled 2013-07-13 (×4): qty 1

## 2013-07-13 SURGICAL SUPPLY — 67 items
BANDAGE ELASTIC 4 VELCRO ST LF (GAUZE/BANDAGES/DRESSINGS) ×4 IMPLANT
BANDAGE ELASTIC 6 VELCRO ST LF (GAUZE/BANDAGES/DRESSINGS) ×4 IMPLANT
BANDAGE ESMARK 6X9 LF (GAUZE/BANDAGES/DRESSINGS) ×2 IMPLANT
BANDAGE GAUZE ELAST BULKY 4 IN (GAUZE/BANDAGES/DRESSINGS) ×4 IMPLANT
BIT DRILL 2.5X2.75 QC CALB (BIT) ×4 IMPLANT
BIT DRILL CALIBRATED 4.3X320MM (BIT) ×2 IMPLANT
BIT DRILL CROWE POINT TWST 4.3 (DRILL) ×2 IMPLANT
BLADE SURG 15 STRL LF DISP TIS (BLADE) IMPLANT
BLADE SURG 15 STRL SS (BLADE)
BLADE SURG ROTATE 9660 (MISCELLANEOUS) IMPLANT
BNDG COHESIVE 6X5 TAN STRL LF (GAUZE/BANDAGES/DRESSINGS) IMPLANT
BNDG ESMARK 6X9 LF (GAUZE/BANDAGES/DRESSINGS) ×4
CLOTH BEACON ORANGE TIMEOUT ST (SAFETY) IMPLANT
CUFF TOURNIQUET SINGLE 34IN LL (TOURNIQUET CUFF) ×4 IMPLANT
CUFF TOURNIQUET SINGLE 44IN (TOURNIQUET CUFF) IMPLANT
DRAPE C-ARM 42X72 X-RAY (DRAPES) ×4 IMPLANT
DRAPE C-ARMOR (DRAPES) ×4 IMPLANT
DRAPE ORTHO SPLIT 77X108 STRL (DRAPES) ×4
DRAPE PROXIMA HALF (DRAPES) ×8 IMPLANT
DRAPE SURG ORHT 6 SPLT 77X108 (DRAPES) ×4 IMPLANT
DRAPE U-SHAPE 47X51 STRL (DRAPES) ×4 IMPLANT
DRILL CALIBRATED 4.3X320MM (BIT) ×4
DRILL CROWE POINT TWIST 4.3 (DRILL) ×4
DRSG PAD ABDOMINAL 8X10 ST (GAUZE/BANDAGES/DRESSINGS) ×12 IMPLANT
DURAPREP 26ML APPLICATOR (WOUND CARE) ×4 IMPLANT
ELECT REM PT RETURN 9FT ADLT (ELECTROSURGICAL) ×4
ELECTRODE REM PT RTRN 9FT ADLT (ELECTROSURGICAL) ×2 IMPLANT
GAUZE XEROFORM 1X8 LF (GAUZE/BANDAGES/DRESSINGS) IMPLANT
GAUZE XEROFORM 5X9 LF (GAUZE/BANDAGES/DRESSINGS) ×4 IMPLANT
GLOVE BIOGEL PI IND STRL 8 (GLOVE) ×6 IMPLANT
GLOVE BIOGEL PI INDICATOR 8 (GLOVE) ×6
GLOVE ECLIPSE 7.5 STRL STRAW (GLOVE) ×12 IMPLANT
GOWN PREVENTION PLUS XLARGE (GOWN DISPOSABLE) IMPLANT
GOWN SRG XL XLNG 56XLVL 4 (GOWN DISPOSABLE) IMPLANT
GOWN STRL NON-REIN LRG LVL3 (GOWN DISPOSABLE) IMPLANT
GOWN STRL NON-REIN XL XLG LVL4 (GOWN DISPOSABLE)
GOWN STRL REUS W/ TWL LRG LVL3 (GOWN DISPOSABLE) ×2 IMPLANT
GOWN STRL REUS W/ TWL XL LVL3 (GOWN DISPOSABLE) ×6 IMPLANT
GOWN STRL REUS W/TWL LRG LVL3 (GOWN DISPOSABLE) ×2
GOWN STRL REUS W/TWL XL LVL3 (GOWN DISPOSABLE) ×6
GUIDEWIRE 2.6X80 BEAD TIP (Wire) ×2 IMPLANT
GUIDWIRE 2.6X80 BEAD TIP (Wire) ×4 IMPLANT
KIT BASIN OR (CUSTOM PROCEDURE TRAY) ×4 IMPLANT
KIT ROOM TURNOVER OR (KITS) ×4 IMPLANT
MANIFOLD NEPTUNE II (INSTRUMENTS) IMPLANT
NAIL TIBIAL PHOENIX 9.0X320MM (Nail) ×4 IMPLANT
NEEDLE 22X1 1/2 (OR ONLY) (NEEDLE) ×4 IMPLANT
NS IRRIG 1000ML POUR BTL (IV SOLUTION) ×4 IMPLANT
PACK GENERAL/GYN (CUSTOM PROCEDURE TRAY) ×4 IMPLANT
PAD ARMBOARD 7.5X6 YLW CONV (MISCELLANEOUS) ×8 IMPLANT
PADDING CAST ABS 6INX4YD NS (CAST SUPPLIES) ×2
PADDING CAST ABS COTTON 6X4 NS (CAST SUPPLIES) ×2 IMPLANT
PLATE TUB 100DEG 5 HO (Plate) ×4 IMPLANT
SCREW CORT TI DBL LEAD 5X32 (Screw) ×4 IMPLANT
SCREW CORT TI DBL LEAD 5X38 (Screw) ×8 IMPLANT
SCREW CORT TI DBL LEAD 5X50 (Screw) ×8 IMPLANT
SCREW CORTICAL 3.5MM  10MM (Screw) ×8 IMPLANT
SCREW CORTICAL 3.5MM 10MM (Screw) ×8 IMPLANT
SPONGE GAUZE 4X4 12PLY (GAUZE/BANDAGES/DRESSINGS) ×8 IMPLANT
STAPLER VISISTAT 35W (STAPLE) ×8 IMPLANT
STOCKINETTE IMPERVIOUS LG (DRAPES) ×4 IMPLANT
SUT VIC AB 0 CTB1 27 (SUTURE) ×4 IMPLANT
SUT VIC AB 2-0 CTB1 (SUTURE) ×4 IMPLANT
SYR CONTROL 10ML LL (SYRINGE) IMPLANT
TOWEL OR 17X24 6PK STRL BLUE (TOWEL DISPOSABLE) ×4 IMPLANT
TOWEL OR 17X26 10 PK STRL BLUE (TOWEL DISPOSABLE) ×4 IMPLANT
WATER STERILE IRR 1000ML POUR (IV SOLUTION) IMPLANT

## 2013-07-13 NOTE — ED Notes (Signed)
Initial BP at 13:05 was taken manually.

## 2013-07-13 NOTE — ED Notes (Signed)
10/10 R lower leg pain. R leg placed in splint, leg raised for support and comfort. Vital signs stable. Family at bedside.
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
## 2013-07-13 NOTE — ED Notes (Signed)
Able to wiggle digits to R foot. Capillary refill less than 2 seconds. Pedal pulses present bilaterally.

## 2013-07-13 NOTE — Preoperative (Signed)
Beta Blockers   Reason not to administer Beta Blockers:Not Applicable 

## 2013-07-13 NOTE — ED Notes (Signed)
Pt returned to exam room. OR consent signed. All belongings and valuables given to family. Awaiting CT scan results at present.

## 2013-07-13 NOTE — Anesthesia Preprocedure Evaluation (Addendum)
Anesthesia Evaluation  Patient identified by MRN, date of birth, ID band Patient awake    Reviewed: Allergy & Precautions, H&P , NPO status , Patient's Chart, lab work & pertinent test results  Airway Mallampati: II TM Distance: >3 FB Neck ROM: Full    Dental  (+) Teeth Intact and Dental Advisory Given   Pulmonary neg pulmonary ROS,  breath sounds clear to auscultation        Cardiovascular negative cardio ROS  Rhythm:Regular Rate:Normal     Neuro/Psych negative neurological ROS  negative psych ROS   GI/Hepatic Neg liver ROS, GERD-  Controlled,  Endo/Other  negative endocrine ROS  Renal/GU negative Renal ROS     Musculoskeletal negative musculoskeletal ROS (+)   Abdominal (+) + obese,   Peds  Hematology negative hematology ROS (+)   Anesthesia Other Findings   Reproductive/Obstetrics                           Anesthesia Physical Anesthesia Plan  ASA: II  Anesthesia Plan: General   Post-op Pain Management:    Induction: Intravenous  Airway Management Planned: Oral ETT  Additional Equipment:   Intra-op Plan:   Post-operative Plan: Extubation in OR  Informed Consent: I have reviewed the patients History and Physical, chart, labs and discussed the procedure including the risks, benefits and alternatives for the proposed anesthesia with the patient or authorized representative who has indicated his/her understanding and acceptance.   Dental advisory given  Plan Discussed with: Surgeon, Anesthesiologist and CRNA  Anesthesia Plan Comments: (R. Comminuted Tib/Fib fracture  Plan GA with oral ETT and RSI)       Anesthesia Quick Evaluation
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
## 2013-07-13 NOTE — ED Notes (Signed)
Pt transported to CT scan.

## 2013-07-13 NOTE — ED Notes (Signed)
X-ray at bedside. Daughter also at bedside.
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
## 2013-07-13 NOTE — ED Notes (Signed)
Pt transported to OR

## 2013-07-13 NOTE — Transfer of Care (Signed)
Immediate Anesthesia Transfer of Care Note  Patient: [name] PaceBrenda Maltese  Procedure(s) Performed: Procedure(s): INTRAMEDULLARY (IM) NAIL TIBIAL (Right) OPEN REDUCTION INTERNAL FIXATION (ORIF) TIBIA FRACTURE (Right)  Patient Location: PACU  Anesthesia Type:General  Level of Consciousness: awake, oriented, patient cooperative and responds to stimulation  Airway & Oxygen Therapy: Patient Spontanous Breathing and Patient connected to nasal cannula oxygen  Post-op Assessment: Report given to PACU RN, Post -op Vital signs reviewed and stable and Patient moving all extremities X 4  Post vital signs: Reviewed and stable  Complications: No apparent anesthesia complications

## 2013-07-13 NOTE — Anesthesia Postprocedure Evaluation (Signed)
Anesthesia Post Note  Patient: [name] PaceBrenda [name]  Procedure(s) Performed: Procedure(s) (LRB): INTRAMEDULLARY (IM) NAIL TIBIAL (Right) OPEN REDUCTION INTERNAL FIXATION (ORIF) TIBIA FRACTURE (Right)  Anesthesia type: General  Patient location: PACU  Post pain: Pain level controlled  Post assessment: Patient's Cardiovascular Status Stable  Last Vitals:  Filed Vitals:   07/13/13 1937  BP:   Pulse:   Temp: 36.6 C  Resp:     Post vital signs: Reviewed and stable  Level of consciousness: alert  Complications: No apparent anesthesia complications
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
## 2013-07-14 MED ORDER — ASPIRIN 325 MG PO TBEC: 325.0000 mg | DELAYED_RELEASE_TABLET | Freq: Two times a day (BID) | ORAL | Status: DC

## 2013-07-14 MED ORDER — OXYCODONE-ACETAMINOPHEN 5-325 MG PO TABS: 1.0000 | ORAL_TABLET | ORAL | Status: DC | PRN

## 2013-07-14 MED ORDER — METHOCARBAMOL 500 MG PO TABS: 500.0000 mg | ORAL_TABLET | Freq: Three times a day (TID) | ORAL | Status: DC | PRN

## 2013-07-14 NOTE — Discharge Instructions (Signed)
Ambulate Non weight bearing on your right leg Elevate your leg as much as possible.

## 2013-07-14 NOTE — Progress Notes (Signed)
Subjective: 1 Day Post-Op Procedure(s) (LRB): INTRAMEDULLARY (IM) NAIL TIBIAL (Right) OPEN REDUCTION INTERNAL FIXATION (ORIF) TIBIA FRACTURE (Right) Patient reports pain as 6 on 0-10 scale.  Doing well. Taking PO. Voiding ok.   Objective: Vital signs in last 24 hours: Temp:  [97.5 F (36.4 C)-98.4 F (36.9 C)] 98 F (36.7 C) (01/28 0624) Pulse Rate:  [64-103] 71 (01/28 0624) Resp:  [11-18] 16 (01/28 0624) BP: (113-161)/(56-102) 113/56 mmHg (01/28 0624) SpO2:  [95 %-100 %] 97 % (01/28 0624) Weight:  [105.8 kg (233 lb 4 oz)] 105.8 kg (233 lb 4 oz) (01/27 2143)  Intake/Output from previous day: 01/27 0701 - 01/28 0700 In: 2913.3 [I.V.:2813.3; IV Piggyback:100] Out: 2100 [Urine:2100] Intake/Output this shift: Total I/O In: 240 [P.O.:240] Out: -    Recent Labs  07/13/13 1322  HGB 13.4    Recent Labs  07/13/13 1322  WBC 14.5*  RBC 4.40  HCT 40.0  PLT 316   No results found for this basename: NA, K, CL, CO2, BUN, CREATININE, GLUCOSE, CALCIUM,  in the last 72 hours No results found for this basename: LABPT, INR,  in the last 72 hours  Right leg: Neurovascular intact Sensation intact distally Intact pulses distally Dorsiflexion/Plantar flexion intact Incision: dressing C/D/I  Assessment/Plan: 1 Day Post-Op Procedure(s) (LRB): INTRAMEDULLARY (IM) NAIL TIBIAL (Right) OPEN REDUCTION INTERNAL FIXATION (ORIF) TIBIA FRACTURE (Right) Discharge home today Ambulate with crutches, NWB on right Aspirin 325 mg BID for DVT prophylaxis Follow up with Dr Luiz BlareGraves in 2 weeks  Jenine Krisher G 07/14/2013, 1:14 PM

## 2013-07-14 NOTE — Care Management Note (Signed)
CARE MANAGEMENT NOTE 07/14/2013  Patient:  [name] [name],[name] [name]   Account Number:  [id number]  Date Initiated:  07/14/2013  Documentation initiated by:  Vance PeperBRADY,Owais Pruett  Subjective/Objective Assessment:   [age] yr old female s/p ORIF of right Tibia     Action/Plan:   Patient will need DME, no further home Health needs.   Anticipated DC Date:  07/14/2013   Anticipated DC Plan:  HOME/SELF CARE      DC Planning Services  CM consult      PAC Choice  DURABLE MEDICAL EQUIPMENT   Choice offered to / List presented to:     DME arranged  [facility]N-[facility]  [facility]      [facility] agency  [facility]     [facility]  [facility]      Status of service:  Completed, signed off Medicare Important Message given?   (If response is "NO", the following Medicare IM given date fields will be blank) Date Medicare IM given:   Date Additional Medicare IM given:    Discharge Disposition:  HOME/SELF CARE

## 2013-07-14 NOTE — Brief Op Note (Signed)
07/13/2013  1:31 AM  PATIENT:  Beverely PaceBrenda Klepper  38 y.o. female  PRE-OPERATIVE DIAGNOSIS:  tib fib fracture, right   POST-OPERATIVE DIAGNOSIS:  tib fib fracture, right   PROCEDURE:  Procedure(s): INTRAMEDULLARY (IM) NAIL TIBIAL (Right) OPEN REDUCTION INTERNAL FIXATION (ORIF) TIBIA FRACTURE (Right)  SURGEON:  Surgeon(s) and Role:    * Harvie JuniorJohn L Kamarion Zagami, MD - Primary  PHYSICIAN ASSISTANT:   ASSISTANTS: bethune   ANESTHESIA:   general  EBL:  Total I/O In: 600 [I.V.:600] Out: 300 [Urine:300]  BLOOD ADMINISTERED:none  DRAINS: none   LOCAL MEDICATIONS USED:  NONE  SPECIMEN:  No Specimen  DISPOSITION OF SPECIMEN:  N/A  COUNTS:  YES  TOURNIQUET:   Total Tourniquet Time Documented: Thigh (Right) - 96 minutes Total: Thigh (Right) - 96 minutes   DICTATION: .Other Dictation: Dictation Number [id number]  PLAN OF CARE: Admit to inpatient   PATIENT DISPOSITION:  PACU - hemodynamically stable.   Delay start of Pharmacological VTE agent (>24hrs) due to surgical blood loss or risk of bleeding: no
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
## 2013-07-19 ENCOUNTER — Encounter (HOSPITAL_COMMUNITY): Payer: Self-pay | Admitting: Orthopedic Surgery

## 2013-11-23 ENCOUNTER — Encounter (HOSPITAL_BASED_OUTPATIENT_CLINIC_OR_DEPARTMENT_OTHER): Payer: Self-pay | Admitting: Emergency Medicine

## 2013-11-23 ENCOUNTER — Emergency Department (HOSPITAL_BASED_OUTPATIENT_CLINIC_OR_DEPARTMENT_OTHER)
Admission: EM | Admit: 2013-11-23 | Discharge: 2013-11-23 | Disposition: A | Discharge status: Home / Self Care | Payer: Medicaid Other | Attending: Emergency Medicine | Admitting: Emergency Medicine

## 2013-11-23 DIAGNOSIS — Z7982 Long term (current) use of aspirin: Secondary | ICD-10-CM | POA: Insufficient documentation

## 2013-11-23 DIAGNOSIS — Z792 Long term (current) use of antibiotics: Secondary | ICD-10-CM | POA: Insufficient documentation

## 2013-11-23 DIAGNOSIS — Z8619 Personal history of other infectious and parasitic diseases: Secondary | ICD-10-CM | POA: Insufficient documentation

## 2013-11-23 DIAGNOSIS — N764 Abscess of vulva: Secondary | ICD-10-CM | POA: Insufficient documentation

## 2013-11-23 MED ORDER — OXYCODONE-ACETAMINOPHEN 5-325 MG PO TABS: 1.0000 | ORAL_TABLET | ORAL | Status: DC | PRN

## 2013-11-23 MED ORDER — HYDROCODONE-ACETAMINOPHEN 5-325 MG PO TABS: 1.0000 | ORAL_TABLET | Freq: Four times a day (QID) | ORAL | Status: DC | PRN

## 2013-11-23 MED ORDER — CLINDAMYCIN HCL 300 MG PO CAPS: 300.0000 mg | ORAL_CAPSULE | Freq: Three times a day (TID) | ORAL | Status: DC

## 2013-11-23 MED ORDER — CLINDAMYCIN HCL 300 MG PO CAPS: 300.0000 mg | ORAL_CAPSULE | Freq: Four times a day (QID) | ORAL | Status: DC

## 2013-11-23 NOTE — Discharge Instructions (Signed)
Have the packing removed in 2 to 3 days as discussed Abscess An abscess is an infected area that contains a collection of pus and debris.It can occur in almost any part of the body. An abscess is also known as a furuncle or boil. CAUSES  An abscess occurs when tissue gets infected. This can occur from blockage of oil or sweat glands, infection of hair follicles, or a minor injury to the skin. As the body tries to fight the infection, pus collects in the area and creates pressure under the skin. This pressure causes pain. People with weakened immune systems have difficulty fighting infections and get certain abscesses more often.  SYMPTOMS Usually an abscess develops on the skin and becomes a painful mass that is red, warm, and tender. If the abscess forms under the skin, you may feel a moveable soft area under the skin. Some abscesses break open (rupture) on their own, but most will continue to get worse without care. The infection can spread deeper into the body and eventually into the bloodstream, causing you to feel ill.  DIAGNOSIS  Your caregiver will take your medical history and perform a physical exam. A sample of fluid may also be taken from the abscess to determine what is causing your infection. TREATMENT  Your caregiver may prescribe antibiotic medicines to fight the infection. However, taking antibiotics alone usually does not cure an abscess. Your caregiver may need to make a small cut (incision) in the abscess to drain the pus. In some cases, gauze is packed into the abscess to reduce pain and to continue draining the area. HOME CARE INSTRUCTIONS   Only take over-the-counter or prescription medicines for pain, discomfort, or fever as directed by your caregiver.  If you were prescribed antibiotics, take them as directed. Finish them even if you start to feel better.  If gauze is used, follow your caregiver's directions for changing the gauze.  To avoid spreading the  infection:  Keep your draining abscess covered with a bandage.  Wash your hands well.  Do not share personal care items, towels, or whirlpools with others.  Avoid skin contact with others.  Keep your skin and clothes clean around the abscess.  Keep all follow-up appointments as directed by your caregiver. SEEK MEDICAL CARE IF:   You have increased pain, swelling, redness, fluid drainage, or bleeding.  You have muscle aches, chills, or a general ill feeling.  You have a fever. MAKE SURE YOU:   Understand these instructions.  Will watch your condition.  Will get help right away if you are not doing well or get worse. Document Released: 03/13/2005 Document Revised: 12/03/2011 Document Reviewed: 08/16/2011 [facility] Patient Information 2014 [place], [place].

## 2013-11-23 NOTE — ED Provider Notes (Signed)
CSN: [id number]     Arrival date & time 11/23/13  1346 History   First MD Initiated Contact with Patient 11/23/13 1447     Chief Complaint  Patient presents with  . Abscess     (Consider location/radiation/quality/duration/timing/severity/associated sxs/prior Treatment) HPI Comments: Pt c/o labial abscess with some drainage  Patient is a 38 y.o. female presenting with abscess. The history is provided by the patient. No language interpreter was used.  Abscess Location:  Ano-genital Abscess quality: redness and weeping   Duration:  1 week Progression:  Worsening Chronicity:  New   Past Medical History  Diagnosis Date  . Shingles    Past Surgical History  Procedure Laterality Date  . Tubal ligation    . Bunionectomy    . Tibia im nail insertion Right 07/13/2013    Procedure: INTRAMEDULLARY (IM) NAIL TIBIAL;  Surgeon: Harvie Junior, MD;  Location: [facility] OR;  Service: Orthopedics;  Laterality: Right;  . Orif tibia fracture Right 07/13/2013    Procedure: OPEN REDUCTION INTERNAL FIXATION (ORIF) TIBIA FRACTURE;  Surgeon: Harvie Junior, MD;  Location: [facility] OR;  Service: Orthopedics;  Laterality: Right;   No family history on file. History  Substance Use Topics  . Smoking status: Passive Smoke Exposure - Never Smoker  . Smokeless tobacco: Never Used  . Alcohol Use: Yes     Comment: social   OB History   Grav Para Term Preterm Abortions TAB SAB Ect Mult Living                 Review of Systems    Allergies  Monistat  Home Medications   Prior to Admission medications   Medication Sig Start Date End Date Taking? Authorizing Provider  aspirin EC 325 MG EC tablet Take 1 tablet (325 mg total) by mouth 2 (two) times daily after a meal. 07/14/13   Matthew Folks, PA-C  Aspirin-Salicylamide-Caffeine (BC HEADACHE POWDER PO) Take 1 packet by mouth daily as needed (headache).     Historical Provider, MD  clindamycin (CLEOCIN) 300 MG capsule Take 1 capsule (300 mg total) by mouth 3  (three) times daily. 11/23/13   Teressa Lower, NP  HYDROcodone-acetaminophen (NORCO/VICODIN) 5-325 MG per tablet Take 1-2 tablets by mouth every 6 (six) hours as needed. 11/23/13   Teressa Lower, NP  methocarbamol (ROBAXIN) 500 MG tablet Take 1 tablet (500 mg total) by mouth every 8 (eight) hours as needed for muscle spasms. 07/14/13   Matthew Folks, PA-C  oxyCODONE-acetaminophen (PERCOCET/ROXICET) 5-325 MG per tablet Take 1-2 tablets by mouth every 4 (four) hours as needed for moderate pain. 07/14/13   Matthew Folks, PA-C   BP 138/90  Pulse 85  Temp(Src) 98 F (36.7 C) (Oral)  Resp 20  Ht 5' 5.5" (1.664 m)  Wt 230 lb (104.327 kg)  BMI 37.68 kg/m2  SpO2 100%  LMP 11/15/2013 Physical Exam  Nursing note and vitals reviewed. Constitutional: She is oriented to person, place, and time.  HENT:  Head: Normocephalic and atraumatic.  Cardiovascular: Normal rate and regular rhythm.   Pulmonary/Chest: Effort normal and breath sounds normal.  Musculoskeletal: Normal range of motion.  Neurological: She is alert and oriented to person, place, and time.  Skin:  Left labial abscess  Psychiatric: She has a normal mood and affect.    ED Course  INCISION AND DRAINAGE Date/Time: 11/23/2013 3:47 PM Performed by: Teressa Lower Authorized by: Teressa Lower Consent: Verbal consent obtained. Risks and benefits: risks, benefits and alternatives were discussed  Consent given by: patient Patient identity confirmed: verbally with patient Type: abscess Body area: anogenital Anesthesia: local infiltration Local anesthetic: lidocaine 2% with epinephrine Scalpel size: 11 Incision type: single straight Complexity: simple Drainage: purulent Drainage amount: moderate Packing material: 1/2 in gauze Patient tolerance: Patient tolerated the procedure well with no immediate complications.   (including critical care time) Labs Review Labs Reviewed - No data to display  Imaging Review No  results found.   EKG Interpretation None      MDM   Final diagnoses:  Labial abscess    Will treat with clinda:pt instructed on packing removal    Teressa LowerVrinda Maryiah Olvey, NP 11/23/13 1548

## 2013-11-23 NOTE — ED Notes (Signed)
Patient asked to change into a gown from waist down.

## 2013-11-23 NOTE — ED Notes (Signed)
Abscess on her labia x 4 days.

## 2013-12-06 NOTE — ED Provider Notes (Signed)
Medical screening examination/treatment/procedure(s) were performed by non-physician practitioner and as supervising physician I was immediately available for consultation/collaboration.   EKG Interpretation None        Aissata Wilmore, MD 12/06/13 0659 

## 2014-01-01 ENCOUNTER — Encounter (HOSPITAL_BASED_OUTPATIENT_CLINIC_OR_DEPARTMENT_OTHER): Payer: Self-pay | Admitting: Emergency Medicine

## 2014-01-01 ENCOUNTER — Emergency Department (HOSPITAL_BASED_OUTPATIENT_CLINIC_OR_DEPARTMENT_OTHER)
Admission: EM | Admit: 2014-01-01 | Discharge: 2014-01-01 | Disposition: A | Discharge status: Home / Self Care | Payer: Medicaid Other | Attending: Emergency Medicine | Admitting: Emergency Medicine

## 2014-01-01 DIAGNOSIS — Z7982 Long term (current) use of aspirin: Secondary | ICD-10-CM | POA: Insufficient documentation

## 2014-01-01 DIAGNOSIS — IMO0002 Reserved for concepts with insufficient information to code with codable children: Secondary | ICD-10-CM | POA: Insufficient documentation

## 2014-01-01 DIAGNOSIS — Z8619 Personal history of other infectious and parasitic diseases: Secondary | ICD-10-CM | POA: Diagnosis not present

## 2014-01-01 DIAGNOSIS — L0291 Cutaneous abscess, unspecified: Secondary | ICD-10-CM

## 2014-01-01 DIAGNOSIS — Z792 Long term (current) use of antibiotics: Secondary | ICD-10-CM | POA: Diagnosis not present

## 2014-01-01 HISTORY — DX: Cutaneous abscess, unspecified: L02.91

## 2014-01-01 MED ORDER — SULFAMETHOXAZOLE-TMP DS 800-160 MG PO TABS
1.0000 | ORAL_TABLET | Freq: Once | ORAL | Status: AC
  Administered 2014-01-01: 1 via ORAL
  Filled 2014-01-01: qty 1

## 2014-01-01 MED ORDER — OXYCODONE-ACETAMINOPHEN 5-325 MG PO TABS: 1.0000 | ORAL_TABLET | Freq: Four times a day (QID) | ORAL | Status: DC | PRN

## 2014-01-01 MED ORDER — OXYCODONE-ACETAMINOPHEN 5-325 MG PO TABS
1.0000 | ORAL_TABLET | Freq: Once | ORAL | Status: AC
  Administered 2014-01-01: 1 via ORAL
  Filled 2014-01-01: qty 1

## 2014-01-01 MED ORDER — SULFAMETHOXAZOLE-TRIMETHOPRIM 800-160 MG PO TABS: 1.0000 | ORAL_TABLET | Freq: Two times a day (BID) | ORAL | Status: DC

## 2014-01-01 NOTE — ED Provider Notes (Signed)
CSN: [id number]     Arrival date & time 01/01/14  0119 History   First MD Initiated Contact with Patient 01/01/14 0134     Chief Complaint  Patient presents with  . Abscess     (Consider location/radiation/quality/duration/timing/severity/associated sxs/prior Treatment) Patient is a 38 y.o. female presenting with abscess. The history is provided by the patient.  Abscess Location:  Shoulder/arm Shoulder/arm abscess location:  R axilla Abscess quality: induration and painful   Red streaking: no   Progression:  Worsening Pain details:    Quality:  Dull   Severity:  Severe   Timing:  Constant   Progression:  Unchanged Chronicity:  Recurrent Context: not insect bite/sting   Relieved by:  Nothing Worsened by:  Nothing tried Ineffective treatments:  None tried Associated symptoms: no anorexia and no fever     Past Medical History  Diagnosis Date  . Shingles   . Abscess    Past Surgical History  Procedure Laterality Date  . Tubal ligation    . Bunionectomy    . Tibia im nail insertion Right 07/13/2013    Procedure: INTRAMEDULLARY (IM) NAIL TIBIAL;  Surgeon: Harvie Junior, MD;  Location: [facility] OR;  Service: Orthopedics;  Laterality: Right;  . Orif tibia fracture Right 07/13/2013    Procedure: OPEN REDUCTION INTERNAL FIXATION (ORIF) TIBIA FRACTURE;  Surgeon: Harvie Junior, MD;  Location: [facility] OR;  Service: Orthopedics;  Laterality: Right;   History reviewed. No pertinent family history. History  Substance Use Topics  . Smoking status: Passive Smoke Exposure - Never Smoker  . Smokeless tobacco: Never Used  . Alcohol Use: Yes     Comment: social   OB History   Grav Para Term Preterm Abortions TAB SAB Ect Mult Living                 Review of Systems  Constitutional: Negative for fever.  Gastrointestinal: Negative for anorexia.  All other systems reviewed and are negative.     Allergies  Monistat  Home Medications   Prior to Admission medications   Medication Sig  Start Date End Date Taking? Authorizing Provider  traMADol (ULTRAM) 50 MG tablet Take 50 mg by mouth every 6 (six) hours as needed.   Yes Historical Provider, MD  aspirin EC 325 MG EC tablet Take 1 tablet (325 mg total) by mouth 2 (two) times daily after a meal. 07/14/13   Matthew Folks, PA-C  Aspirin-Salicylamide-Caffeine (BC HEADACHE POWDER PO) Take 1 packet by mouth daily as needed (headache).     Historical Provider, MD  clindamycin (CLEOCIN) 300 MG capsule Take 1 capsule (300 mg total) by mouth every 6 (six) hours. 11/23/13   Teressa Lower, NP  methocarbamol (ROBAXIN) 500 MG tablet Take 1 tablet (500 mg total) by mouth every 8 (eight) hours as needed for muscle spasms. 07/14/13   Matthew Folks, PA-C  oxyCODONE-acetaminophen (PERCOCET/ROXICET) 5-325 MG per tablet Take 1-2 tablets by mouth every 4 (four) hours as needed for moderate pain. 07/14/13   Matthew Folks, PA-C  oxyCODONE-acetaminophen (PERCOCET/ROXICET) 5-325 MG per tablet Take 1-2 tablets by mouth every 4 (four) hours as needed for moderate pain or severe pain. 11/23/13   Teressa Lower, NP   BP 131/76  Pulse 89  Temp(Src) 98 F (36.7 C) (Oral)  Resp 18  Ht 5\' 5"  (1.651 m)  Wt 230 lb (104.327 kg)  BMI 38.27 kg/m2  SpO2 100% Physical Exam  Constitutional: She is oriented to person, place, and time. She  appears well-developed and well-nourished. No distress.  HENT:  Head: Normocephalic and atraumatic.  Mouth/Throat: Oropharynx is clear and moist.  Eyes: Conjunctivae are normal. Pupils are equal, round, and reactive to light.  Neck: Normal range of motion. Neck supple.  Cardiovascular: Normal rate, regular rhythm and intact distal pulses.   Pulmonary/Chest: Effort normal and breath sounds normal. She has no wheezes. She has no rales.  Abdominal: Soft. Bowel sounds are normal.  Musculoskeletal: Normal range of motion.  Neurological: She is alert and oriented to person, place, and time.  Skin: No petechiae noted. There is  erythema.     Psychiatric: She has a normal mood and affect.    ED Course  Procedures (including critical care time) Labs Review Labs Reviewed - No data to display  Imaging Review No results found.   EKG Interpretation None      MDM   Final diagnoses:  None    INCISION AND DRAINAGE Performed by: [name] AwePALUMBO-RASCH,Davionna Blacksher K Consent: Verbal consent obtained. Risks and benefits: risks, benefits and alternatives were discussed Type: abscess  Body area:right axilla  Anesthesia: local infiltration  Incision was made with a scalpel.  Local anesthetic: lidocaine 1%with epinephrine  Anesthetic total: 4 ml  Complexity: complex Blunt dissection to break up loculations  Drainage: purulent  Drainage amount: moderate  Patient tolerance: Patient tolerated the procedure well with no immediate complications.   Bactrim DS x 7 days and pain medication       Joseeduardo Brix K Vern Guerette-Rasch, MD 01/01/14 [phone]

## 2014-01-01 NOTE — ED Notes (Signed)
Abscess to right axilla area

## 2014-01-01 NOTE — Discharge Instructions (Signed)
Community-Associated MRSA CA-MRSA stands for community-associated methicillin-resistant Staphylococcus aureus. MRSA is a type of bacteria that is resistant to some common antibiotics. It can cause infections in the skin and many other places in the body. Staphylococcus aureus, often called "staph," is a bacteria that normally lives on the skin or in the nose. Staph on the surface of the skin or in the nose does not cause problems. However, if the staph enters the body through a cut, wound, or break in the skin, an infection can happen. Up until recently, infections with the MRSA type of staph mainly occurred in hospitals and other healthcare settings. There are now increasing problems with MRSA infections in the community as well. Infections with MRSA may be very serious or even life-threatening. CA-MRSA is becoming more common. It is known to spread in crowded settings, in jails and prisons, and in situations where there is close skin-to-skin contact, such as during sporting events or in locker rooms. MRSA can be spread through shared items, such as children's toys, razors, towels, or sports equipment.  CAUSES All staph, including MRSA, are normally harmless unless they enter the body through a scratch, cut, or wound, such as with surgery. All staph, including MRSA, can be spread from person-to-person by touching contaminated objects or through direct contact.  MRSA now causes illness in people who have not been in hospitals or other healthcare facilities. Cases of MRSA diseases in the community have been associated with:  Recent antibiotic use.  Sharing contaminated towels or clothes.  Having active skin diseases.  Participating in contact sports.  Living in crowded settings.  Intravenous (IV) drug use.  Community-associated MRSA infections are usually skin infections, but may cause other severe illnesses.  Staph bacteria are one of the most common causes of skin infection. However, they are  also a common cause of pneumonia, bone or joint infections, and bloodstream infections. DIAGNOSIS Diagnosis of MRSA is done by cultures of fluid samples that may come from:  Swabs taken from cuts or wounds in infected areas.  Nasal swabs.  Saliva or deep cough specimens from the lungs (sputum).  Urine.  Blood. Many people are "colonized" with MRSA but have no signs of infection. This means that people carry the MRSA germ on their skin or in their nose and may never develop MRSA infection.  TREATMENT  Treatment varies and is based on how serious, how deep, or how extensive the infection is. For example:  Some skin infections, such as a small boil or abscess, may be treated by draining yellowish-white fluid (pus) from the site of the infection.  Deeper or more widespread soft tissue infections are usually treated with surgery to drain pus and with antibiotic medicine given by vein or by mouth. This may be recommended even if you are pregnant.  Serious infections may require a hospital stay. If antibiotics are given, they may be needed for several weeks. PREVENTION Because many people are colonized with staph, including MRSA, preventing the spread of the bacteria from person-to-person is most important. The best way to prevent the spread of bacteria and other germs is through proper hand washing or by using alcohol-based hand disinfectants. The following are other ways to help prevent MRSA infection within community settings.   Wash your hands frequently with soap and water for at least 15 seconds. Otherwise, use alcohol-based hand disinfectants when soap and water is not available.  Make sure people who live with you wash their hands often, too.  Do not share  personal items. For example, avoid sharing razors and other personal hygiene items, towels, clothing, and athletic equipment.  Wash and dry your clothes and bedding at the warmest temperatures recommended on the labels.  Keep  wounds covered. Pus from infected sores may contain MRSA and other bacteria. Keep cuts and abrasions clean and covered with germ-free (sterile), dry bandages until they are healed.  If you have a wound that appears infected, ask your caregiver if a culture for MRSA and other bacteria should be done.  If you are breastfeeding, talk to your caregiver about MRSA. You may be asked to temporarily stop breastfeeding. HOME CARE INSTRUCTIONS   Take your antibiotics as directed. Finish them even if you start to feel better.  Avoid close contact with those around you as much as possible. Do not use towels, razors, toothbrushes, bedding, or other items that will be used by others.  To fight the infection, follow your caregiver's instructions for wound care. Wash your hands before and after changing your bandages.  If you have an intravascular device, such as a catheter, make sure you know how to care for it.  Be sure to tell any healthcare providers that you have MRSA so they are aware of your infection. SEEK IMMEDIATE MEDICAL CARE IF:  The infection appears to be getting worse. Signs include:  Increased warmth, redness, or tenderness around the wound site.  A red line that extends from the infection site.  A dark color in the area around the infection.  Wound drainage that is tan, yellow, or green.  A bad smell coming from the wound.  You feel sick to your stomach (nauseous) and throw up (vomit) or cannot keep medicine down.  You have a fever.  Your baby is older than 3 months with a rectal temperature of 102 F (38.9 C) or higher.  Your baby is 74 months old or younger with a rectal temperature of 100.4 F (38 C) or higher.  You have difficulty breathing. MAKE SURE YOU:   Understand these instructions.  Will watch your condition.  Will get help right away if you are not doing well or get worse. Document Released: 09/06/2005 Document Revised: 08/26/2011 Document Reviewed:  09/06/2010 [facility] Patient Information 2015 [place], [place]. This information is not intended to replace advice given to you by your health care provider. Make sure you discuss any questions you have with your health care provider.  Abscess Care After An abscess (also called a boil or furuncle) is an infected area that contains a collection of pus. Signs and symptoms of an abscess include pain, tenderness, redness, or hardness, or you may feel a moveable soft area under your skin. An abscess can occur anywhere in the body. The infection may spread to surrounding tissues causing cellulitis. A cut (incision) by the surgeon was made over your abscess and the pus was drained out. Gauze may have been packed into the space to provide a drain that will allow the cavity to heal from the inside outwards. The boil may be painful for 5 to 7 days. Most people with a boil do not have high fevers. Your abscess, if seen early, may not have localized, and may not have been lanced. If not, another appointment may be required for this if it does not get better on its own or with medications. HOME CARE INSTRUCTIONS   Only take over-the-counter or prescription medicines for pain, discomfort, or fever as directed by your caregiver.  When you bathe, soak and then  remove gauze or iodoform packs at least daily or as directed by your caregiver. You may then wash the wound gently with mild soapy water. Repack with gauze or do as your caregiver directs. SEEK IMMEDIATE MEDICAL CARE IF:   You develop increased pain, swelling, redness, drainage, or bleeding in the wound site.  You develop signs of generalized infection including muscle aches, chills, fever, or a general ill feeling.  An oral temperature above 102 F (38.9 C) develops, not controlled by medication. See your caregiver for a recheck if you develop any of the symptoms described above. If medications (antibiotics) were prescribed, take them as directed. Document  Released: 12/20/2004 Document Revised: 08/26/2011 Document Reviewed: 08/17/2007 [facility] Patient Information 2015 [place], [place]. This information is not intended to replace advice given to you by your health care provider. Make sure you discuss any questions you have with your health care provider.

## 2014-02-20 ENCOUNTER — Emergency Department (HOSPITAL_BASED_OUTPATIENT_CLINIC_OR_DEPARTMENT_OTHER)
Admission: EM | Admit: 2014-02-20 | Discharge: 2014-02-20 | Disposition: A | Discharge status: Home / Self Care | Payer: Medicaid Other | Attending: Emergency Medicine | Admitting: Emergency Medicine

## 2014-02-20 ENCOUNTER — Encounter (HOSPITAL_BASED_OUTPATIENT_CLINIC_OR_DEPARTMENT_OTHER): Payer: Self-pay | Admitting: Emergency Medicine

## 2014-02-20 DIAGNOSIS — Z7982 Long term (current) use of aspirin: Secondary | ICD-10-CM | POA: Insufficient documentation

## 2014-02-20 DIAGNOSIS — Z79899 Other long term (current) drug therapy: Secondary | ICD-10-CM | POA: Insufficient documentation

## 2014-02-20 DIAGNOSIS — M545 Low back pain, unspecified: Secondary | ICD-10-CM | POA: Insufficient documentation

## 2014-02-20 DIAGNOSIS — G8929 Other chronic pain: Secondary | ICD-10-CM | POA: Insufficient documentation

## 2014-02-20 DIAGNOSIS — Z8619 Personal history of other infectious and parasitic diseases: Secondary | ICD-10-CM | POA: Insufficient documentation

## 2014-02-20 DIAGNOSIS — Z872 Personal history of diseases of the skin and subcutaneous tissue: Secondary | ICD-10-CM | POA: Diagnosis not present

## 2014-02-20 DIAGNOSIS — Z792 Long term (current) use of antibiotics: Secondary | ICD-10-CM | POA: Insufficient documentation

## 2014-02-20 DIAGNOSIS — M549 Dorsalgia, unspecified: Secondary | ICD-10-CM

## 2014-02-20 MED ORDER — METHOCARBAMOL 500 MG PO TABS: 1000.0000 mg | ORAL_TABLET | Freq: Four times a day (QID) | ORAL | Status: DC | PRN

## 2014-02-20 NOTE — Discharge Instructions (Signed)
For pain control you may take up to 800mg of Motrin (also known as ibuprofen). That is usually 4 over the counter pills,  3 times a day. Take with food to minimize stomach irritation  ° °You can also take  tylenol (acetaminophen) 975mg (this is 3 over the counter pills) four times a day. Do not drink alcohol or combine with other medications that have acetaminophen as an ingredient (Read the labels!).   ° °For breakthrough pain you may take Robaxin. Do not drink alcohol, drive or operate heavy machinery when taking Robaxin. ° °Please follow with your primary care doctor in the next 2 days for a check-up. They must obtain records for further management.  ° °Do not hesitate to return to the Emergency Department for any new, worsening or concerning symptoms.  ° °Back Pain, Adult °Low back pain is very common. About 1 in 5 people have back pain. The cause of low back pain is rarely dangerous. The pain often gets better over time. About half of people with a sudden onset of back pain feel better in just 2 weeks. About 8 in 10 people feel better by 6 weeks.  °CAUSES °Some common causes of back pain include: °· Strain of the muscles or ligaments supporting the spine. °· Wear and tear (degeneration) of the spinal discs. °· Arthritis. °· Direct injury to the back. °DIAGNOSIS °Most of the time, the direct cause of low back pain is not known. However, back pain can be treated effectively even when the exact cause of the pain is unknown. Answering your caregiver's questions about your overall health and symptoms is one of the most accurate ways to make sure the cause of your pain is not dangerous. If your caregiver needs more information, he or she may order lab work or imaging tests (X-rays or MRIs). However, even if imaging tests show changes in your back, this usually does not require surgery. °HOME CARE INSTRUCTIONS °For many people, back pain returns. Since low back pain is rarely dangerous, it is often a condition that  people can learn to manage on their own.  °· Remain active. It is stressful on the back to sit or stand in one place. Do not sit, drive, or stand in one place for more than 30 minutes at a time. Take short walks on level surfaces as soon as pain allows. Try to increase the length of time you walk each day. °· Do not stay in bed. Resting more than 1 or 2 days can delay your recovery. °· Do not avoid exercise or work. Your body is made to move. It is not dangerous to be active, even though your back may hurt. Your back will likely heal faster if you return to being active before your pain is gone. °· Pay attention to your body when you  bend and lift. Many people have less discomfort when lifting if they bend their knees, keep the load close to their bodies, and avoid twisting. Often, the most comfortable positions are those that put less stress on your recovering back. °· Find a comfortable position to sleep. Use a firm mattress and lie on your side with your knees slightly bent. If you lie on your back, put a pillow under your knees. °· Only take over-the-counter or prescription medicines as directed by your caregiver. Over-the-counter medicines to reduce pain and inflammation are often the most helpful. Your caregiver may prescribe muscle relaxant drugs. These medicines help dull your pain so you can more quickly return to your normal activities and healthy exercise. °· Put ice on the injured area. °¨   Put ice in a plastic bag. °¨ Place a towel between your skin and the bag. °¨ Leave the ice on for 15-20 minutes, 03-04 times a day for the first 2 to 3 days. After that, ice and heat may be alternated to reduce pain and spasms. °· Ask your caregiver about trying back exercises and gentle massage. This may be of some benefit. °· Avoid feeling anxious or stressed. Stress increases muscle tension and can worsen back pain. It is important to recognize when you are anxious or stressed and learn ways to manage it. Exercise  is a great option. °SEEK MEDICAL CARE IF: °· You have pain that is not relieved with rest or medicine. °· You have pain that does not improve in 1 week. °· You have new symptoms. °· You are generally not feeling well. °SEEK IMMEDIATE MEDICAL CARE IF:  °· You have pain that radiates from your back into your legs. °· You develop new bowel or bladder control problems. °· You have unusual weakness or numbness in your arms or legs. °· You develop nausea or vomiting. °· You develop abdominal pain. °· You feel faint. °Document Released: 06/03/2005 Document Revised: 12/03/2011 Document Reviewed: 10/05/2013 °[employer]® Patient Information ©2015 [employer]. This information is not intended to replace advice given to you by your health care provider. Make sure you discuss any questions you have with your health care provider. ° °

## 2014-02-20 NOTE — ED Provider Notes (Signed)
CSN: [id number]     Arrival date & time 02/20/14  1237 History   First MD Initiated Contact with Patient 02/20/14 1459     Chief Complaint  Patient presents with  . Back Pain     (Consider location/radiation/quality/duration/timing/severity/associated sxs/prior Treatment) HPI  [name] [name] is a 38 y.o. female complaining of left lumbar back pain onset 2 days ago. This is atraumatic. Patient's been taking Motrin with little relief. States that muscle relaxers help her and is requesting prescription for same. Denies fever, chills, change in bowel or bladder habits, h/o IDVU or cancer, numbness or weakness.   Past Medical History  Diagnosis Date  . Shingles   . Abscess    Past Surgical History  Procedure Laterality Date  . Tubal ligation    . Bunionectomy    . Tibia im nail insertion Right 07/13/2013    Procedure: INTRAMEDULLARY (IM) NAIL TIBIAL;  Surgeon: Harvie Junior, MD;  Location: [facility] OR;  Service: Orthopedics;  Laterality: Right;  . Orif tibia fracture Right 07/13/2013    Procedure: OPEN REDUCTION INTERNAL FIXATION (ORIF) TIBIA FRACTURE;  Surgeon: Harvie Junior, MD;  Location: [facility] OR;  Service: Orthopedics;  Laterality: Right;   No family history on file. History  Substance Use Topics  . Smoking status: Passive Smoke Exposure - Never Smoker  . Smokeless tobacco: Never Used  . Alcohol Use: Yes     Comment: social   OB History   Grav Para Term Preterm Abortions TAB SAB Ect Mult Living                 Review of Systems  10 systems reviewed and found to be negative, except as noted in the HPI.   Allergies  Monistat  Home Medications   Prior to Admission medications   Medication Sig Start Date End Date Taking? Authorizing Provider  aspirin EC 325 MG EC tablet Take 1 tablet (325 mg total) by mouth 2 (two) times daily after a meal. 07/14/13   Matthew Folks, PA-C  Aspirin-Salicylamide-Caffeine (BC HEADACHE POWDER PO) Take 1 packet by mouth daily as needed (headache).      Historical Provider, MD  clindamycin (CLEOCIN) 300 MG capsule Take 1 capsule (300 mg total) by mouth every 6 (six) hours. 11/23/13   Teressa Lower, NP  methocarbamol (ROBAXIN) 500 MG tablet Take 1 tablet (500 mg total) by mouth every 8 (eight) hours as needed for muscle spasms. 07/14/13   Matthew Folks, PA-C  methocarbamol (ROBAXIN) 500 MG tablet Take 2 tablets (1,000 mg total) by mouth 4 (four) times daily as needed (Pain). 02/20/14   Sharah Finnell, PA-C  oxyCODONE-acetaminophen (PERCOCET) 5-325 MG per tablet Take 1 tablet by mouth every 6 (six) hours as needed. 01/01/14   April Smitty Cords, MD  oxyCODONE-acetaminophen (PERCOCET/ROXICET) 5-325 MG per tablet Take 1-2 tablets by mouth every 4 (four) hours as needed for moderate pain. 07/14/13   Matthew Folks, PA-C  oxyCODONE-acetaminophen (PERCOCET/ROXICET) 5-325 MG per tablet Take 1-2 tablets by mouth every 4 (four) hours as needed for moderate pain or severe pain. 11/23/13   Teressa Lower, NP  sulfamethoxazole-trimethoprim (SEPTRA DS) 800-160 MG per tablet Take 1 tablet by mouth every 12 (twelve) hours. 01/01/14   April K Palumbo-Rasch, MD  traMADol (ULTRAM) 50 MG tablet Take 50 mg by mouth every 6 (six) hours as needed.    Historical Provider, MD   BP 143/94  Pulse 85  Temp(Src) 98.1 F (36.7 C) (Oral)  Resp 18  Ht  (1.651 m)  Wt 235 lb (106.595 kg)  BMI 39.11 kg/m2  SpO2 100% Physical Exam  Nursing note and vitals reviewed. Constitutional: She is oriented to person, place, and time. She appears well-developed and well-nourished. No distress.  HENT:  Head: Normocephalic and atraumatic.  Mouth/Throat: Oropharynx is clear and moist.  Eyes: Conjunctivae and EOM are normal. Pupils are equal, round, and reactive to light.  Neck: Normal range of motion.  Cardiovascular: Normal rate, regular rhythm and intact distal pulses.   Pulmonary/Chest: Effort normal and breath sounds normal. No stridor.  Abdominal: Soft. Bowel sounds are  normal. There is no tenderness.  Musculoskeletal: Normal range of motion.  Neurological: She is alert and oriented to person, place, and time.  No point tenderness to percussion of lumbar spinal processes.  No TTP or paraspinal muscular spasm. Strength is 5 out of 5 to bilateral lower extremities at hip and knee; extensor hallucis longus 5 out of 5. Ankle strength 5 out of 5, no clonus, neurovascularly intact. No saddle anaesthesia. Patellar reflexes are 2+ bilaterally.     Psychiatric: She has a normal mood and affect.    ED Course  Procedures (including critical care time) Labs Review Labs Reviewed - No data to display  Imaging Review No results found.   EKG Interpretation None      MDM   Final diagnoses:  Exacerbation of chronic back pain    Filed Vitals:   02/20/14 1240 02/20/14 1252  BP: 143/94   Pulse: 85   Temp: 98.1 F (36.7 C)   TempSrc: Oral   Resp: 18   Height: 5' 5.5" (1.664 m)  (1.651 m)  Weight: 225 lb (102.059 kg) 235 lb (106.595 kg)  SpO2: 100%     [name] [name] is a 38 y.o. female presenting with  back pain.  No neurological deficits and normal neuro exam.  Patient can walk but states is painful.  No loss of bowel or bladder control.  No concern for cauda equina.  No fever, night sweats, weight loss, h/o cancer, IVDU.  RICE protocol and pain medicine indicated and discussed with patient.=  Evaluation does not show pathology that would require ongoing emergent intervention or inpatient treatment. Pt is hemodynamically stable and mentating appropriately. Discussed findings and plan with patient/guardian, who agrees with care plan. All questions answered. Return precautions discussed and outpatient follow up given.   Discharge Medication List as of 02/20/2014  3:06 PM    START taking these medications   Details  !! methocarbamol (ROBAXIN) 500 MG tablet Take 2 tablets (1,000 mg total) by mouth 4 (four) times daily as needed (Pain)., Starting  02/20/2014, Until Discontinued, Print     !! - Potential duplicate medications found. Please discuss with provider.           Wynetta Emery, PA-C 02/22/14 [phone]
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
## 2014-02-22 NOTE — ED Provider Notes (Signed)
Medical screening examination/treatment/procedure(s) were performed by non-physician practitioner and as supervising physician I was immediately available for consultation/collaboration.   EKG Interpretation None        Toy Cookey, MD 02/22/14 1003
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
## 2015-01-22 ENCOUNTER — Emergency Department (HOSPITAL_BASED_OUTPATIENT_CLINIC_OR_DEPARTMENT_OTHER)
Admission: EM | Admit: 2015-01-22 | Discharge: 2015-01-22 | Discharge status: Against Medical Advice | Payer: Medicaid Other | Attending: Emergency Medicine | Admitting: Emergency Medicine

## 2015-01-22 ENCOUNTER — Encounter (HOSPITAL_BASED_OUTPATIENT_CLINIC_OR_DEPARTMENT_OTHER): Payer: Self-pay | Admitting: Adult Health

## 2015-01-22 DIAGNOSIS — R102 Pelvic and perineal pain: Secondary | ICD-10-CM | POA: Insufficient documentation

## 2015-01-22 DIAGNOSIS — N898 Other specified noninflammatory disorders of vagina: Secondary | ICD-10-CM | POA: Diagnosis not present

## 2015-01-22 NOTE — ED Notes (Signed)
Presents with vaginal irritation began one week ago on Sunday-endorses white vaginal discharge and severe itching-denies foul odor or abdominal pain-took diflucan with no relief.

## 2015-01-22 NOTE — ED Notes (Signed)
Pt decided she did not want to be seen-she will call her MD in the morning.

## 2015-12-23 ENCOUNTER — Emergency Department (HOSPITAL_COMMUNITY): Payer: No Typology Code available for payment source

## 2015-12-23 ENCOUNTER — Emergency Department (HOSPITAL_COMMUNITY)
Admission: EM | Admit: 2015-12-23 | Discharge: 2015-12-23 | Disposition: A | Discharge status: Home / Self Care | Payer: No Typology Code available for payment source | Attending: Emergency Medicine | Admitting: Emergency Medicine

## 2015-12-23 DIAGNOSIS — S8011XA Contusion of right lower leg, initial encounter: Secondary | ICD-10-CM | POA: Insufficient documentation

## 2015-12-23 DIAGNOSIS — Y92410 Unspecified street and highway as the place of occurrence of the external cause: Secondary | ICD-10-CM | POA: Insufficient documentation

## 2015-12-23 DIAGNOSIS — Y999 Unspecified external cause status: Secondary | ICD-10-CM | POA: Diagnosis not present

## 2015-12-23 DIAGNOSIS — Y939 Activity, unspecified: Secondary | ICD-10-CM | POA: Insufficient documentation

## 2015-12-23 DIAGNOSIS — S20411A Abrasion of right back wall of thorax, initial encounter: Secondary | ICD-10-CM | POA: Insufficient documentation

## 2015-12-23 DIAGNOSIS — S8991XA Unspecified injury of right lower leg, initial encounter: Secondary | ICD-10-CM | POA: Diagnosis present

## 2015-12-23 MED ORDER — OXYCODONE-ACETAMINOPHEN 5-325 MG PO TABS: 1.0000 | ORAL_TABLET | ORAL | Status: DC | PRN

## 2015-12-23 MED ORDER — SODIUM CHLORIDE 0.9 % IV BOLUS (SEPSIS): 500.0000 mL | Freq: Once | INTRAVENOUS | Status: DC

## 2015-12-23 NOTE — Discharge Instructions (Signed)

## 2015-12-23 NOTE — Progress Notes (Signed)
Orthopedic Tech Progress Note Patient Details:  [name] [name] 09/02/1975 [id number]  Ortho Devices Type of Ortho Device: Crutches Ortho Device/Splint Interventions: Ordered, Adjustment   Jennye MoccasinHughes, Rei Contee Craig 12/23/2015, 4:37 PM
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
## 2016-05-08 ENCOUNTER — Encounter (HOSPITAL_BASED_OUTPATIENT_CLINIC_OR_DEPARTMENT_OTHER): Payer: Self-pay

## 2016-05-08 ENCOUNTER — Emergency Department (HOSPITAL_BASED_OUTPATIENT_CLINIC_OR_DEPARTMENT_OTHER)
Admission: EM | Admit: 2016-05-08 | Discharge: 2016-05-08 | Disposition: A | Discharge status: Home / Self Care | Payer: Medicaid Other | Attending: Emergency Medicine | Admitting: Emergency Medicine

## 2016-05-08 DIAGNOSIS — J020 Streptococcal pharyngitis: Secondary | ICD-10-CM | POA: Insufficient documentation

## 2016-05-08 LAB — RAPID STREP SCREEN (MED CTR MEBANE ONLY): STREPTOCOCCUS, GROUP A SCREEN (DIRECT): POSITIVE — AB

## 2016-05-08 MED ORDER — DEXAMETHASONE SODIUM PHOSPHATE 10 MG/ML IJ SOLN
10.0000 mg | Freq: Once | INTRAMUSCULAR | Status: AC
  Administered 2016-05-08: 10 mg via INTRAMUSCULAR
  Filled 2016-05-08: qty 1

## 2016-05-08 MED ORDER — IBUPROFEN 400 MG PO TABS
600.0000 mg | ORAL_TABLET | Freq: Once | ORAL | Status: AC
  Administered 2016-05-08: 600 mg via ORAL
  Filled 2016-05-08: qty 1

## 2016-05-08 MED ORDER — FLUCONAZOLE 200 MG PO TABS: 200.0000 mg | ORAL_TABLET | Freq: Once | ORAL | 0 refills | Status: AC

## 2016-05-08 MED ORDER — PENICILLIN G BENZATHINE 1200000 UNIT/2ML IM SUSP
1.2000 10*6.[IU] | Freq: Once | INTRAMUSCULAR | Status: AC
  Administered 2016-05-08: 1.2 10*6.[IU] via INTRAMUSCULAR
  Filled 2016-05-08: qty 2

## 2016-05-08 NOTE — ED Provider Notes (Signed)
[facility] Provider Note   CSN: [id number] Arrival date & time: 05/08/16  1945  By signing my name below, I, [name] [name], attest that this documentation has been prepared under the direction and in the presence of [name] Mentink, PA-C. Electronically Signed: Linna Darnerussell [name], Scribe. 05/08/2016. 8:18 PM.  History   Chief Complaint Chief Complaint  Patient presents with  . Sore Throat    The history is provided by the patient. No language interpreter was used.     HPI Comments: [name] [name] is a 40 y.o. female who presents to the Emergency Department complaining of sudden onset, constant, worsening, sore throat beginning early yesterday morning. She states she has noticed spots and redness to the back of her throat. She notes associated fever, chills, cervical adenopathy, and neck soreness. Pt notes some occasional nausea due to the severity of her sore throat as well. She has tried ibuprofen and alka seltzer with minimal relief of her symptoms. She denies cough, difficulty breathing, sinus pain, headache, vomiting, or any other associated symptoms.     Past Medical History:  Diagnosis Date  . Abscess   . Shingles     Patient Active Problem List   Diagnosis Date Noted  . Fracture of tibia with fibula, right, closed 07/13/2013    Past Surgical History:  Procedure Laterality Date  . BUNIONECTOMY    . CHOLECYSTECTOMY    . ORIF TIBIA FRACTURE Right 07/13/2013   Procedure: OPEN REDUCTION INTERNAL FIXATION (ORIF) TIBIA FRACTURE;  Surgeon: [name] JuniorJohn L Graves, MD;  Location: [facility];  Service: Orthopedics;  Laterality: Right;  . TIBIA IM NAIL INSERTION Right 07/13/2013   Procedure: INTRAMEDULLARY (IM) NAIL TIBIAL;  Surgeon: [name] JuniorJohn L Graves, MD;  Location: [facility] OR;  Service: Orthopedics;  Laterality: Right;  . TUBAL LIGATION      OB History    No data available       Home Medications    Prior to Admission medications   Medication Sig Start Date End Date Taking?  Authorizing Provider  fluconazole (DIFLUCAN) 200 MG tablet Take 1 tablet (200 mg total) by mouth once. 05/08/16 05/08/16  Anselm PancoastShawn C Sanaii Caporaso, PA-C    Family History No family history on file.  Social History Social History  Substance Use Topics  . Smoking status: Never Smoker  . Smokeless tobacco: Never Used  . Alcohol use Yes     Comment: social     Allergies   Monistat [miconazole]   Review of Systems Review of Systems  Constitutional: Positive for chills and fever.  HENT: Positive for sore throat. Negative for postnasal drip, sinus pain and sinus pressure.   Respiratory: Negative for cough and shortness of breath.   Gastrointestinal: Positive for nausea. Negative for vomiting.  Neurological: Negative for headaches.  Hematological: Positive for adenopathy (cervical).  All other systems reviewed and are negative.    Physical Exam Updated Vital Signs BP 119/82 (BP Location: Left Arm)   Pulse 120   Temp 102.5 [name] (39.2 C) (Oral) Comment: Triage RN notified  Resp 22   Ht 5\' 5"  (1.651 m)   Wt 250 lb (113.4 kg)   LMP 04/10/2016   SpO2 98%   BMI 41.60 kg/m   Physical Exam  Constitutional: She appears well-developed and well-nourished. No distress.  HENT:  Head: Normocephalic and atraumatic.  Mouth/Throat: Oropharyngeal exudate, posterior oropharyngeal edema and posterior oropharyngeal erythema present.  Edema, erythema, and exudate that is equal bilaterally.  Eyes: Conjunctivae are normal.  Neck: Neck supple.  Cardiovascular: Normal rate, regular rhythm, normal heart sounds and intact distal pulses.   Pulmonary/Chest: Effort normal and breath sounds normal. No respiratory distress.  Abdominal: Soft. There is no tenderness. There is no guarding.  Musculoskeletal: She exhibits no edema.  Lymphadenopathy:    She has cervical adenopathy.  Anterior cervical adenopathy bilaterally  Neurological: She is alert.  Skin: Skin is warm and dry. She is not diaphoretic.    Psychiatric: She has a normal mood and affect. Her behavior is normal.  Nursing note and vitals reviewed.    ED Treatments / Results  Labs (all labs ordered are listed, but only abnormal results are displayed) Labs Reviewed  RAPID STREP SCREEN (NOT AT [facility]) - Abnormal; Notable for the following:       Result Value   Streptococcus, Group A Screen (Direct) POSITIVE (*)    All other components within normal limits    EKG  EKG Interpretation None       Radiology No results found.  Procedures Procedures (including critical care time)  DIAGNOSTIC STUDIES: Oxygen Saturation is 98% on RA, normal by my interpretation.    COORDINATION OF CARE: 8:26 PM Discussed treatment plan with pt at bedside and pt agreed to plan.  Medications Ordered in ED Medications  ibuprofen (ADVIL,MOTRIN) tablet 600 mg (600 mg Oral Given 05/08/16 2018)  dexamethasone (DECADRON) injection 10 mg (10 mg Intramuscular Given 05/08/16 2031)  penicillin g benzathine (BICILLIN LA) 1200000 UNIT/2ML injection 1.2 Million Units (1.2 Million Units Intramuscular Given 05/08/16 2036)     Initial Impression / Assessment and Plan / ED Course  I have reviewed the triage vital signs and the nursing notes.  Pertinent labs & imaging results that were available during my care of the patient were reviewed by me and considered in my medical decision making (see chart for details).  Clinical Course     Patient presents with sore throat and fever beginning yesterday. Positive rapid strep. Treated with penicillin and Decadron. Patient requested Diflucan to accompany her antibiotic. Supportive care and return precautions discussed.    Vitals:   05/08/16 1950 05/08/16 2059  BP: 119/82 150/86  Pulse: 120 104  Resp: 22 20  Temp: 102.5 [name] (39.2 C) 101.4 [name] (38.6 C)  TempSrc: Oral   SpO2: 98% 97%  Weight: 113.4 kg   Height: 5\' 5"  (1.651 m)       Final Clinical Impressions(s) / ED Diagnoses   Final diagnoses:   Strep throat    New Prescriptions New Prescriptions   FLUCONAZOLE (DIFLUCAN) 200 MG TABLET    Take 1 tablet (200 mg total) by mouth once.     Anselm PancoastShawn C Kery Batzel, PA-C 05/08/16 2113    [name] FossaElizabeth Rees, MD 05/08/16 [phone]

## 2016-05-08 NOTE — ED Notes (Signed)
C/o sore throat chills, fever,  Denies cough, or congestion  Onset Monday night

## 2016-05-08 NOTE — Discharge Instructions (Addendum)
Your strep test was positive. You have been treated for this while here in the ED. Drink plenty of fluids and get plenty of rest. You should be drinking at least a one half to one liter of water an hour to stay hydrated. Ibuprofen, Naproxen, or Tylenol for pain or fever. Warm liquids or Chloraseptic spray may help soothe a sore throat. Follow up with a primary care provider, as needed, for any future management of this issue.

## 2016-05-08 NOTE — ED Triage Notes (Signed)
C/o sore throat/fever x 3 days-NAD-steady gait

## 2017-06-10 ENCOUNTER — Emergency Department (HOSPITAL_BASED_OUTPATIENT_CLINIC_OR_DEPARTMENT_OTHER): Payer: Worker's Compensation

## 2017-06-10 ENCOUNTER — Emergency Department (HOSPITAL_BASED_OUTPATIENT_CLINIC_OR_DEPARTMENT_OTHER)
Admission: EM | Admit: 2017-06-10 | Discharge: 2017-06-10 | Disposition: A | Discharge status: Home / Self Care | Payer: Worker's Compensation | Attending: Emergency Medicine | Admitting: Emergency Medicine

## 2017-06-10 ENCOUNTER — Encounter (HOSPITAL_BASED_OUTPATIENT_CLINIC_OR_DEPARTMENT_OTHER): Payer: Self-pay | Admitting: Emergency Medicine

## 2017-06-10 ENCOUNTER — Other Ambulatory Visit: Payer: Self-pay

## 2017-06-10 DIAGNOSIS — Y9289 Other specified places as the place of occurrence of the external cause: Secondary | ICD-10-CM | POA: Insufficient documentation

## 2017-06-10 DIAGNOSIS — Y93F9 Activity, other caregiving: Secondary | ICD-10-CM | POA: Insufficient documentation

## 2017-06-10 DIAGNOSIS — S62666A Nondisplaced fracture of distal phalanx of right little finger, initial encounter for closed fracture: Secondary | ICD-10-CM

## 2017-06-10 DIAGNOSIS — Y990 Civilian activity done for income or pay: Secondary | ICD-10-CM | POA: Insufficient documentation

## 2017-06-10 DIAGNOSIS — S62646A Nondisplaced fracture of proximal phalanx of right little finger, initial encounter for closed fracture: Secondary | ICD-10-CM | POA: Insufficient documentation

## 2017-06-10 MED ORDER — ACETAMINOPHEN 500 MG PO TABS
1000.0000 mg | ORAL_TABLET | Freq: Once | ORAL | Status: AC
  Administered 2017-06-10: 1000 mg via ORAL
  Filled 2017-06-10: qty 2

## 2017-06-10 MED ORDER — IBUPROFEN 400 MG PO TABS
400.0000 mg | ORAL_TABLET | Freq: Once | ORAL | Status: AC
  Administered 2017-06-10: 400 mg via ORAL
  Filled 2017-06-10: qty 1

## 2017-06-10 NOTE — ED Provider Notes (Signed)
[facility] EMERGENCY DEPARTMENT Provider Note   CSN: [id number] Arrival date & time: 06/10/17  0732     History   Chief Complaint Chief Complaint  Patient presents with  . Finger Injury    right fifth    HPI [name] [name] is a 41 y.o. female.  HPI  41yo female presents with concern for small finger pain and swelling after a patient at work grabbed and pulled it.  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 Occurred at [facility]. If not moving finger, is 3/10 pain, more severe with movement. Hurts to extend it.  Slight numbness to distal dorsum.  Also notes swelling. Has not yet had anything for pain.     Past Medical History:  Diagnosis Date  . Abscess   . Shingles     Patient Active Problem List   Diagnosis Date Noted  . Fracture of tibia with fibula, right, closed 07/13/2013    Past Surgical History:  Procedure Laterality Date  . BUNIONECTOMY    . CHOLECYSTECTOMY    . ORIF TIBIA FRACTURE Right 07/13/2013   Procedure: OPEN REDUCTION INTERNAL FIXATION (ORIF) TIBIA FRACTURE;  Surgeon: Harvie JuniorJohn L Graves, MD;  Location: [facility];  Service: Orthopedics;  Laterality: Right;  . TIBIA IM NAIL INSERTION Right 07/13/2013   Procedure: INTRAMEDULLARY (IM) NAIL TIBIAL;  Surgeon: Harvie JuniorJohn L Graves, MD;  Location: [facility];  Service: Orthopedics;  Laterality: Right;  . TUBAL LIGATION      OB History    No data available       Home Medications    Prior to Admission medications   Not on File    Family History History reviewed. No pertinent family history.  Social History Social History   Tobacco Use  . Smoking status: Never Smoker  . Smokeless tobacco: Never Used  Substance Use Topics  . Alcohol use: Yes    Comment: social  . Drug use: No     Allergies   Monistat [miconazole]   Review of Systems Review of Systems  Constitutional: Negative for fever.  Musculoskeletal: Positive for  arthralgias.  Skin: Negative for wound.     Physical Exam Updated Vital Signs BP 128/78   Pulse 78   Temp 98 F (36.7 C) (Oral)   Resp 18   Ht 5\' 5"  (1.651 m)   Wt 117.9 kg (260 lb)   LMP 05/16/2017   SpO2 98%   BMI 43.27 kg/m   Physical Exam  Constitutional: She is oriented to person, place, and time. She appears well-developed and well-nourished. No distress.  HENT:  Head: Normocephalic and atraumatic.  Eyes: Conjunctivae and EOM are normal.  Neck: Normal range of motion.  Cardiovascular: Normal rate, regular rhythm and intact distal pulses.  Pulmonary/Chest: Effort normal. No respiratory distress.  Musculoskeletal: She exhibits no edema.       Right hand: She exhibits tenderness, bony tenderness (distal small finger) and swelling (distal smal lfinger). She exhibits normal capillary refill and no deformity. Decreased range of motion: able to flex and extend digits, reports pain with extension. Ulnar distribution: reports altered sensation at tip of small finger on dorsal side.  Neurological: She is alert and oriented to person, place, and time.  Skin: Skin is warm and dry. No rash noted. She is not diaphoretic. No erythema.  Nursing note and vitals reviewed.    ED Treatments / Results  Labs (all labs ordered  are listed, but only abnormal results are displayed) Labs Reviewed - No data to display  EKG  EKG Interpretation None       Radiology Dg Finger Little Right  Result Date: 06/10/2017 CLINICAL DATA:  Right fifth finger injury. EXAM: RIGHT LITTLE FINGER 2+V COMPARISON:  None. FINDINGS: Examination demonstrates a transverse fracture of the midshaft of the fifth distal phalanx without significant displacement. Remainder of the exam is unremarkable. IMPRESSION: Transverse fracture of the midshaft of the fifth distal phalanx without significant displacement. Electronically Signed   By: Elberta Fortisaniel  Boyle M.D.   On: 06/10/2017 08:17    Procedures Procedures (including  critical care time)  Medications Ordered in ED Medications  ibuprofen (ADVIL,MOTRIN) tablet 400 mg (400 mg Oral Given 06/10/17 0828)  acetaminophen (TYLENOL) tablet 1,000 mg (1,000 mg Oral Given 06/10/17 [id number])     Initial Impression / Assessment and Plan / ED Course  I have reviewed the triage vital signs and the nursing notes.  Pertinent labs & imaging results that were available during my care of the patient were reviewed by me and considered in my medical decision making (see chart for details).    41yo female presents with concern for right small finger pain and swelling after an aggressive patient at work grabbed and pulled it.  XR completed shows distal phalanx fracture. Placed in splint. Recommend ice, elevation, hand surgery follow up. Patient discharged in stable condition with understanding of reasons to return.   Final Clinical Impressions(s) / ED Diagnoses   Final diagnoses:  Closed nondisplaced fracture of distal phalanx of right little finger, initial encounter    ED Discharge Orders    None       Alvira MondaySchlossman, Davi Rotan, MD 06/10/17 [phone]

## 2017-06-10 NOTE — ED Triage Notes (Signed)
Pt reports right fifth finger injury at work at 5 Am today. Painful ROM, some swelling noted.

## 2019-01-18 ENCOUNTER — Encounter (HOSPITAL_BASED_OUTPATIENT_CLINIC_OR_DEPARTMENT_OTHER): Payer: Self-pay

## 2019-01-18 ENCOUNTER — Emergency Department (HOSPITAL_BASED_OUTPATIENT_CLINIC_OR_DEPARTMENT_OTHER)
Admission: EM | Admit: 2019-01-18 | Discharge: 2019-01-18 | Disposition: A | Discharge status: Home / Self Care | Payer: Self-pay | Attending: Emergency Medicine | Admitting: Emergency Medicine

## 2019-01-18 ENCOUNTER — Other Ambulatory Visit: Payer: Self-pay

## 2019-01-18 DIAGNOSIS — M791 Myalgia, unspecified site: Secondary | ICD-10-CM | POA: Insufficient documentation

## 2019-01-18 DIAGNOSIS — R509 Fever, unspecified: Secondary | ICD-10-CM | POA: Insufficient documentation

## 2019-01-18 DIAGNOSIS — J020 Streptococcal pharyngitis: Secondary | ICD-10-CM | POA: Insufficient documentation

## 2019-01-18 DIAGNOSIS — Z20828 Contact with and (suspected) exposure to other viral communicable diseases: Secondary | ICD-10-CM | POA: Insufficient documentation

## 2019-01-18 LAB — SARS CORONAVIRUS 2 BY RT PCR (HOSPITAL ORDER, PERFORMED IN ~~LOC~~ HOSPITAL LAB): SARS Coronavirus 2: NEGATIVE

## 2019-01-18 LAB — GROUP A STREP BY PCR: Group A Strep by PCR: DETECTED — AB

## 2019-01-18 MED ORDER — ACETAMINOPHEN 325 MG PO TABS
650.0000 mg | ORAL_TABLET | Freq: Once | ORAL | Status: AC
  Administered 2019-01-18: 13:00:00 650 mg via ORAL
  Filled 2019-01-18: qty 2

## 2019-01-18 MED ORDER — PENICILLIN G BENZATHINE 1200000 UNIT/2ML IM SUSP
1.2000 10*6.[IU] | Freq: Once | INTRAMUSCULAR | Status: AC
  Administered 2019-01-18: 1.2 10*6.[IU] via INTRAMUSCULAR
  Filled 2019-01-18: qty 2

## 2019-01-18 MED ORDER — IBUPROFEN 400 MG PO TABS
600.0000 mg | ORAL_TABLET | Freq: Once | ORAL | Status: AC
  Administered 2019-01-18: 14:00:00 600 mg via ORAL
  Filled 2019-01-18: qty 1

## 2019-01-18 NOTE — ED Triage Notes (Signed)
Pt c/o sore throat/fever started last night-pt with +covid test 7/17-"was cleared on 7/27"-pt NAD-to triage in w/c-NAD

## 2019-01-18 NOTE — Discharge Instructions (Signed)
It was our pleasure to provide your ER care today - we hope that you feel better.  Your strep test is positive, and you were treated in the ER.  Rest. Drink plenty of fluids. Take acetaminophen or ibuprofen as need. Use throat lozenges as need.   Your covid test is pending and should be resulted in the next 1-2 days - call back to get results.   Return to ER if worse, new symptoms, trouble breathing, unable to swallow, or other concern.

## 2019-01-18 NOTE — ED Provider Notes (Signed)
[place] EMERGENCY DEPARTMENT Provider Note   CSN: [id number] Arrival date & time: 01/18/19  1256     History   Chief Complaint Chief Complaint  Patient presents with  . Sore Throat    HPI [name] [name] is a 43 y.o. female.     Patient c/o sore throat, fever and body aches, onset yesterday. Symptoms acute onset, moderate, persistent, worse today. Denies cough or sob. No chest pain or discomfort. No abd pain or nvd. No dysuria or gu c/o. No rash. Throat pain is bilateral. No trouble breathing or swallowing. No known mono or strep exposure. States had covid test + 2-3 weeks ago, but states several 'tests got screwed up' and she was asymptomatic then - no cough, fever or any other c/o. 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   The history is provided by the patient.  Sore Throat Pertinent negatives include no chest pain, no abdominal pain, no headaches and no shortness of breath.    Past Medical History:  Diagnosis Date  . Abscess   . Shingles     Patient Active Problem List   Diagnosis Date Noted  . Fracture of tibia with fibula, right, closed 07/13/2013    Past Surgical History:  Procedure Laterality Date  . BUNIONECTOMY    . CHOLECYSTECTOMY    . ORIF TIBIA FRACTURE Right 07/13/2013   Procedure: OPEN REDUCTION INTERNAL FIXATION (ORIF) TIBIA FRACTURE;  Surgeon: Alta Corning, MD;  Location: [place];  Service: Orthopedics;  Laterality: Right;  . TIBIA IM NAIL INSERTION Right 07/13/2013   Procedure: INTRAMEDULLARY (IM) NAIL TIBIAL;  Surgeon: Alta Corning, MD;  Location: [place];  Service: Orthopedics;  Laterality: Right;  . TUBAL LIGATION       OB History   No obstetric history on file.      Home Medications    Prior to Admission medications   Not on File    Family History No family history on file.  Social History Social History   Tobacco Use  . Smoking status: Never Smoker  . Smokeless tobacco: Never Used  Substance Use Topics  .  Alcohol use: Not Currently  . Drug use: No     Allergies   Monistat [miconazole]   Review of Systems Review of Systems  Constitutional: Positive for fever.  HENT: Positive for sore throat. Negative for rhinorrhea and sinus pain.   Eyes: Negative for pain and redness.  Respiratory: Negative for cough and shortness of breath.   Cardiovascular: Negative for chest pain.  Gastrointestinal: Negative for abdominal pain, diarrhea and vomiting.  Endocrine: Negative for polyuria.  Genitourinary: Negative for dysuria and flank pain.  Musculoskeletal: Negative for back pain, neck pain and neck stiffness.  Skin: Negative for rash.  Neurological: Negative for headaches.  Hematological: Does not bruise/bleed easily.  Psychiatric/Behavioral: Negative for confusion.     Physical Exam Updated Vital Signs BP (!) 142/88 (BP Location: Right Arm)   Pulse (!) 115   Temp (!) 103.1 F (39.5 C) (Oral)   Resp 20   Ht 1.651 m (5\' 5" )   Wt 112 kg   LMP 01/04/2019   SpO2 98%   BMI 41.10 kg/m   Physical Exam Vitals signs and nursing note reviewed.  Constitutional:      Appearance: Normal appearance. She is well-developed.  HENT:     Head: Atraumatic.     Nose: Nose normal.     Mouth/Throat:     Mouth: Mucous membranes  are moist.     Comments: Pharynx erythematous. No asymmetric swelling or abscess. No trismus.  Eyes:     General: No scleral icterus.    Conjunctiva/sclera: Conjunctivae normal.  Neck:     Musculoskeletal: Normal range of motion and neck supple. No neck rigidity or muscular tenderness.     Vascular: No carotid bruit.     Trachea: No tracheal deviation.     Comments: No stiffness or rigidity.  Cardiovascular:     Rate and Rhythm: Regular rhythm. Tachycardia present.     Pulses: Normal pulses.     Heart sounds: Normal heart sounds. No murmur. No friction rub. No gallop.   Pulmonary:     Effort: Pulmonary effort is normal. No respiratory distress.     Breath sounds:  Normal breath sounds.  Abdominal:     General: Bowel sounds are normal. There is no distension.     Palpations: Abdomen is soft.     Tenderness: There is no abdominal tenderness. There is no guarding.     Comments: No hsm.   Genitourinary:    Comments: No cva tenderness.  Musculoskeletal:        General: No swelling.     Right lower leg: No edema.     Left lower leg: No edema.  Lymphadenopathy:     Cervical: No cervical adenopathy.  Skin:    General: Skin is warm and dry.     Findings: No rash.  Neurological:     Mental Status: She is alert.     Comments: Alert, speech normal.   Psychiatric:        Mood and Affect: Mood normal.      ED Treatments / Results  Labs (all labs ordered are listed, but only abnormal results are displayed) Labs Reviewed  GROUP A STREP BY PCR - Abnormal; Notable for the following components:      Result Value   Group A Strep by PCR DETECTED (*)    All other components within normal limits  SARS CORONAVIRUS 2 (HOSPITAL ORDER, PERFORMED IN [facility])    EKG None  Radiology No results found.  Procedures Procedures (including critical care time)  Medications Ordered in ED Medications  acetaminophen (TYLENOL) tablet 650 mg (650 mg Oral Given 01/18/19 1314)     Initial Impression / Assessment and Plan / ED Course  I have reviewed the triage vital signs and the nursing notes.  Pertinent labs & imaging results that were available during my care of the patient were reviewed by me and considered in my medical decision making (see chart for details).  Labs sent.  Reviewed nursing notes and prior charts for additional history.   Motrin po. Po fluids.   Labs reviewed by me - strep pos. covid is sendout - pt instructed to f/u. Quarantine instructions.  Patient requests im shot of pcn for treatment of strep.   Bicillin la given.  Pt appears well hydrated and stable for d/c.   Return precautions provided.     Final  Clinical Impressions(s) / ED Diagnoses   Final diagnoses:  None    ED Discharge Orders    None       Cathren LaineSteinl, Deyani Hegarty, MD 01/18/19 1511

## 2019-01-21 ENCOUNTER — Emergency Department (HOSPITAL_BASED_OUTPATIENT_CLINIC_OR_DEPARTMENT_OTHER)
Admission: EM | Admit: 2019-01-21 | Discharge: 2019-01-21 | Disposition: A | Discharge status: Home / Self Care | Payer: Self-pay | Attending: Emergency Medicine | Admitting: Emergency Medicine

## 2019-01-21 ENCOUNTER — Other Ambulatory Visit: Payer: Self-pay

## 2019-01-21 ENCOUNTER — Encounter (HOSPITAL_BASED_OUTPATIENT_CLINIC_OR_DEPARTMENT_OTHER): Payer: Self-pay

## 2019-01-21 DIAGNOSIS — J020 Streptococcal pharyngitis: Secondary | ICD-10-CM | POA: Insufficient documentation

## 2019-01-21 MED ORDER — DEXAMETHASONE SODIUM PHOSPHATE 10 MG/ML IJ SOLN
10.0000 mg | Freq: Once | INTRAMUSCULAR | Status: AC
  Administered 2019-01-21: 10 mg via INTRAMUSCULAR
  Filled 2019-01-21: qty 1

## 2019-01-21 MED ORDER — AMOXICILLIN-POT CLAVULANATE 875-125 MG PO TABS: 1.0000 | ORAL_TABLET | Freq: Two times a day (BID) | ORAL | 0 refills | Status: DC

## 2019-01-21 MED FILL — AMOX-CLAV 875-125 MG TABLET: 875-125 | 7 days supply | Qty: 14 | Fill #0

## 2019-01-21 NOTE — Discharge Instructions (Signed)
Please read and follow all provided instructions.  Your diagnoses today include:  1. Streptococcal pharyngitis     Tests performed today include:  Vital signs. See below for your results today.   Medications prescribed:   Augmentin - antibiotic  You have been prescribed an antibiotic medicine: take the entire course of medicine even if you are feeling better. Stopping early can cause the antibiotic not to work.  Take any medications prescribed only as directed.   Home care instructions:  Please read the educational materials provided and follow any instructions contained in this packet.  Follow-up instructions: Please follow-up with your primary care provider as needed for further evaluation of your symptoms.  Return instructions:   Please return to the Emergency Department if you experience worsening symptoms.   Return if you have worsening problems swallowing, your neck becomes swollen, you cannot swallow your saliva or your voice becomes muffled.   Return with high persistent fever, persistent vomiting, or if you have trouble breathing.   Please return if you have any other emergent concerns.  Additional Information:  Your vital signs today were: BP (!) 140/96 (BP Location: Left Arm)    Pulse 80    Temp 98 F (36.7 C) (Oral)    Resp 16    Ht 5\' 5"  (1.651 m)    Wt 112 kg    LMP 01/04/2019    SpO2 98%    BMI 41.09 kg/m  If your blood pressure (BP) was elevated above 135/85 this visit, please have this repeated by your doctor within one month. --------------

## 2019-01-21 NOTE — ED Notes (Signed)
Pt discharged home with instructions for follow up.  Verbalizes good understanding.

## 2019-01-21 NOTE — ED Provider Notes (Signed)
[facility] EMERGENCY DEPARTMENT Provider Note   CSN: [id number] Arrival date & time: 01/21/19  1605     History   Chief Complaint Chief Complaint  Patient presents with  . Sore Throat    HPI [name] [name] is a 43 y.o. female.     Patient presents with continued sore throat and worsening ear pain after being diagnosed with strep throat on 01/18/2019.  Patient had negative COVID swab at that time.  Patient states that she has continued to have fevers up until yesterday.  Her pain was severe but as of this afternoon is improved to a 5 out of 10.  She has had worsening pain in her ears, right greater than left.  No nausea or vomiting.  No chest pain, shortness of breath, or cough.  States that the penicillin shot given to her previously has not helped her symptoms.  She has been taking ibuprofen at home with temporary improvement.  She has been drinking liquids such as broth because of her throat symptoms.  No vomiting.     Past Medical History:  Diagnosis Date  . Abscess   . Shingles     Patient Active Problem List   Diagnosis Date Noted  . Fracture of tibia with fibula, right, closed 07/13/2013    Past Surgical History:  Procedure Laterality Date  . BUNIONECTOMY    . CHOLECYSTECTOMY    . ORIF TIBIA FRACTURE Right 07/13/2013   Procedure: OPEN REDUCTION INTERNAL FIXATION (ORIF) TIBIA FRACTURE;  Surgeon: Alta Corning, MD;  Location: [facility];  Service: Orthopedics;  Laterality: Right;  . TIBIA IM NAIL INSERTION Right 07/13/2013   Procedure: INTRAMEDULLARY (IM) NAIL TIBIAL;  Surgeon: Alta Corning, MD;  Location: [facility];  Service: Orthopedics;  Laterality: Right;  . TUBAL LIGATION       OB History   No obstetric history on file.      Home Medications    Prior to Admission medications   Medication Sig Start Date End Date Taking? Authorizing Provider  amoxicillin-clavulanate (AUGMENTIN) 875-125 MG tablet Take 1 tablet by mouth every 12 (twelve) hours. 01/21/19    Carlisle Cater, PA-C    Family History History reviewed. No pertinent family history.  Social History Social History   Tobacco Use  . Smoking status: Never Smoker  . Smokeless tobacco: Never Used  Substance Use Topics  . Alcohol use: Not Currently  . Drug use: No     Allergies   Monistat [miconazole]   Review of Systems Review of Systems  Constitutional: Positive for fever (improved). Negative for chills and fatigue.  HENT: Positive for ear pain and sore throat. Negative for congestion, rhinorrhea and sinus pressure.   Eyes: Negative for redness.  Respiratory: Negative for cough and wheezing.   Gastrointestinal: Negative for abdominal pain, diarrhea, nausea and vomiting.  Genitourinary: Negative for dysuria.  Musculoskeletal: Negative for myalgias and neck stiffness.  Skin: Negative for rash.  Neurological: Negative for headaches.  Hematological: Negative for adenopathy.     Physical Exam Updated Vital Signs BP (!) 140/96 (BP Location: Left Arm)   Pulse 80   Temp 98 F (36.7 C) (Oral)   Resp 16   Ht 5\' 5"  (1.651 m)   Wt 112 kg   LMP 01/04/2019   SpO2 98%   BMI 41.09 kg/m   Physical Exam Vitals signs and nursing note reviewed.  Constitutional:      Appearance: She is well-developed.  HENT:     Head: Normocephalic  and atraumatic.     Right Ear: A middle ear effusion is present. Tympanic membrane is erythematous (mild) and bulging.     Left Ear: Tympanic membrane, ear canal and external ear normal.  No middle ear effusion. Tympanic membrane is not erythematous or bulging.     Mouth/Throat:     Pharynx: Posterior oropharyngeal erythema present.     Tonsils: Tonsillar exudate present. No tonsillar abscesses. 3+ on the right. 2+ on the left.  Eyes:     General:        Right eye: No discharge.        Left eye: No discharge.     Conjunctiva/sclera: Conjunctivae normal.  Neck:     Musculoskeletal: Normal range of motion and neck supple.  Cardiovascular:      Rate and Rhythm: Normal rate and regular rhythm.     Heart sounds: Normal heart sounds.  Pulmonary:     Effort: Pulmonary effort is normal.     Breath sounds: Normal breath sounds.  Abdominal:     Palpations: Abdomen is soft.     Tenderness: There is no abdominal tenderness.  Skin:    General: Skin is warm and dry.  Neurological:     Mental Status: She is alert.      ED Treatments / Results  Labs (all labs ordered are listed, but only abnormal results are displayed) Labs Reviewed - No data to display  EKG None  Radiology No results found.  Procedures Procedures (including critical care time)  Medications Ordered in ED Medications  dexamethasone (DECADRON) injection 10 mg (10 mg Intramuscular Given 01/21/19 1639)     Initial Impression / Assessment and Plan / ED Course  I have reviewed the triage vital signs and the nursing notes.  Pertinent labs & imaging results that were available during my care of the patient were reviewed by me and considered in my medical decision making (see chart for details).        Patient seen and examined.  Patient is requesting additional antibiotic persistent symptoms after penicillin shot for strep pharyngitis.  She has a right middle ear effusion with some erythema, likely eustachian tube dysfunction in setting of pharyngitis however may represent early otitis media.  Patient will be prescribed 7 days of Augmentin.  She is also given IM Bicillin to help with her throat symptoms.  No evidence of retropharyngeal abscess or peritonsillar abscess today.  Vital signs reviewed and are as follows: BP (!) 140/96 (BP Location: Left Arm)   Pulse 80   Temp 98 F (36.7 C) (Oral)   Resp 16   Ht 5\' 5"  (1.651 m)   Wt 112 kg   LMP 01/04/2019   SpO2 98%   BMI 41.09 kg/m   4:52 PM we discussed signs and symptoms of peritonsillar abscess and need to return if these occur.  Patient will continue symptom control measures at home.  Work note given.   Encouraged return with inability to swallow, worsening fevers, severe unilateral throat pain.  Patient verbalizes understanding.  Final Clinical Impressions(s) / ED Diagnoses   Final diagnoses:  Streptococcal pharyngitis   Patient presents the emergency department 3 days after being diagnosed with streptococcal pharyngitis.  Her symptoms seem to be improving over the course of today and she has been afebrile since yesterday.  She has developed worsening ear pain and possible early right-sided otitis media with effusion.  Treatment as above.  Patient appears well.  No signs of retropharyngeal abscess, peritonsillar abscess,  Lemierre's syndrome, epiglottitis today.    ED Discharge Orders         Ordered    amoxicillin-clavulanate (AUGMENTIN) 875-125 MG tablet  Every 12 hours     01/21/19 1635           Renne CriglerGeiple, Dillon Livermore, PA-C 01/21/19 1653    Jacalyn LefevreHaviland, Julie, MD 01/21/19 1737

## 2019-01-21 NOTE — ED Triage Notes (Signed)
Pt was treated for strep throat Monday, with pcn injection symptoms began Sunday.  Pt reports no improvement with symptoms, now reports pain bilat ears, worse on right.  Fevers at home.  Last took ibuprofen this morning.

## 2019-11-11 ENCOUNTER — Other Ambulatory Visit: Payer: Self-pay

## 2019-11-11 ENCOUNTER — Encounter (HOSPITAL_BASED_OUTPATIENT_CLINIC_OR_DEPARTMENT_OTHER): Payer: Self-pay | Admitting: *Deleted

## 2019-11-11 ENCOUNTER — Emergency Department (HOSPITAL_BASED_OUTPATIENT_CLINIC_OR_DEPARTMENT_OTHER)
Admission: EM | Admit: 2019-11-11 | Discharge: 2019-11-11 | Disposition: A | Discharge status: Home / Self Care | Payer: Self-pay | Attending: Emergency Medicine | Admitting: Emergency Medicine

## 2019-11-11 DIAGNOSIS — T7840XA Allergy, unspecified, initial encounter: Secondary | ICD-10-CM | POA: Insufficient documentation

## 2019-11-11 DIAGNOSIS — R21 Rash and other nonspecific skin eruption: Secondary | ICD-10-CM | POA: Insufficient documentation

## 2019-11-11 MED ORDER — PREDNISONE 20 MG PO TABS: 40.0000 mg | ORAL_TABLET | Freq: Every day | ORAL | 0 refills | Status: AC

## 2019-11-11 MED ORDER — HYDROXYZINE HCL 25 MG PO TABS: 25.0000 mg | ORAL_TABLET | Freq: Four times a day (QID) | ORAL | 0 refills | Status: DC | PRN

## 2019-11-11 MED FILL — predniSONE 20 MG TABS: 20 | 5 days supply | Qty: 10 | Fill #0

## 2019-11-11 MED FILL — hydrOXYzine HCL 25 MG TABS: 25 | 3 days supply | Qty: 12 | Fill #0

## 2019-11-11 NOTE — Discharge Instructions (Signed)
Today your rash appears concerning for a allergic reaction from insect bites. I have given you a prescription for steroids today.  Some common side effects include feelings of extra energy, feeling warm, increased appetite, and stomach upset.  If you are diabetic your sugars may run higher than usual.   You are being prescribed a medication which may make you sleepy. For 24 hours after one dose please do not drive, operate heavy machinery, care for a small child with out another adult present, or perform any activities that may cause harm to you or someone else if you were to fall asleep or be impaired.   Please start taking either Allegra, Claritin, or Zyrtec at least once a day. In addition you may take 1 to 2 pills of Benadryl (25 to 50 mg) as needed.  I would recommend that you get an exterminator to evaluate your home for possible insects.  Please return to the ER if your symptoms worsen or you have other concerns.

## 2019-11-11 NOTE — ED Provider Notes (Signed)
[facility] EMERGENCY DEPARTMENT Provider Note   CSN: [id number] Arrival date & time: 11/11/19  1417     History Chief Complaint  Patient presents with  . Insect Bite    [name] [name] is a 44 y.o. female with no pertinent past medical history who presents today for evaluation of a rash. She reports that the rash started about 5 days ago with a solitary lesion on the dorsum of her right wrist. She states that the rash is itchy. She denies prodromal pain symptoms. She states that since then the rash has spread and she has spots scattered on her bilateral legs, arms and spots with mild edema on the left side of her face. She denies any fevers. No cough or shortness of breath. She tried Benadryl 3 days ago without significant relief of her symptoms. She states that, despite her only symptom being a rash, she did try an EpiPen yesterday to see if that would help however reports that it only made her feel jittery and did not help her reaction.    Denies any new lotions, detergents, soaps or other exposures. She states that she has not seen any bedbugs or other insects.  HPI     Past Medical History:  Diagnosis Date  . Abscess   . Shingles     Patient Active Problem List   Diagnosis Date Noted  . Fracture of tibia with fibula, right, closed 07/13/2013    Past Surgical History:  Procedure Laterality Date  . BUNIONECTOMY    . CHOLECYSTECTOMY    . ORIF TIBIA FRACTURE Right 07/13/2013   Procedure: OPEN REDUCTION INTERNAL FIXATION (ORIF) TIBIA FRACTURE;  Surgeon: Harvie Junior, MD;  Location: [facility];  Service: Orthopedics;  Laterality: Right;  . TIBIA IM NAIL INSERTION Right 07/13/2013   Procedure: INTRAMEDULLARY (IM) NAIL TIBIAL;  Surgeon: Harvie Junior, MD;  Location: [facility] OR;  Service: Orthopedics;  Laterality: Right;  . TUBAL LIGATION       OB History   No obstetric history on file.     No family history on file.  Social History   Tobacco Use  . Smoking status:  Never Smoker  . Smokeless tobacco: Never Used  Substance Use Topics  . Alcohol use: Not Currently  . Drug use: No    Home Medications Prior to Admission medications   Medication Sig Start Date End Date Taking? Authorizing Provider  amoxicillin-clavulanate (AUGMENTIN) 875-125 MG tablet Take 1 tablet by mouth every 12 (twelve) hours. 01/21/19   Renne Crigler, PA-C  hydrOXYzine (ATARAX/VISTARIL) 25 MG tablet Take 1 tablet (25 mg total) by mouth every 6 (six) hours as needed for itching. 11/11/19   Cristina Gong, PA-C  predniSONE (DELTASONE) 20 MG tablet Take 2 tablets (40 mg total) by mouth daily for 5 days. 11/11/19 11/16/19  Cristina Gong, PA-C    Allergies    Monistat [miconazole]  Review of Systems   Review of Systems  Constitutional: Negative for chills and fever.  HENT: Positive for facial swelling.   Musculoskeletal: Negative for back pain.  Skin: Positive for rash.  All other systems reviewed and are negative.   Physical Exam Updated Vital Signs BP 139/90   Pulse 82   Temp 98.1 F (36.7 C) (Oral)   Resp 16   Ht 5\' 5"  (1.651 m)   Wt 113.4 kg   LMP 11/01/2019   SpO2 96%   BMI 41.60 kg/m   Physical Exam Vitals and nursing note reviewed.  Constitutional:      General: She is not in acute distress.    Appearance: She is not ill-appearing.  HENT:     Head: Normocephalic.     Comments: Trace edema on the left lower eyelid with urticarial rash on the left side of face merrily however does also extend on the right side occasionally. Eyes:     Conjunctiva/sclera: Conjunctivae normal.     Comments: Vision grossly intact  Cardiovascular:     Rate and Rhythm: Normal rate.  Pulmonary:     Effort: Pulmonary effort is normal. No respiratory distress.  Skin:    Comments: On the dorsum of the right wrist there is a 1 cm area of erythema without significant surrounding induration or fluctuance. Scant serous discharge without purulence. There are multiple scabbed  over papules present on the bilateral arms and legs, roughly 20 in total. There is significant secondary excoriation making evaluation difficult. None of the lesions appear to have secondary infection.  Neurological:     General: No focal deficit present.     Mental Status: She is alert.     Cranial Nerves: No cranial nerve deficit.     Sensory: No sensory deficit.     Motor: No weakness.     ED Results / Procedures / Treatments   Labs (all labs ordered are listed, but only abnormal results are displayed) Labs Reviewed - No data to display  EKG None  Radiology No results found.  Procedures Procedures (including critical care time)  Medications Ordered in ED Medications - No data to display  ED Course  I have reviewed the triage vital signs and the nursing notes.  Pertinent labs & imaging results that were available during my care of the patient were reviewed by me and considered in my medical decision making (see chart for details).    MDM Rules/Calculators/A&P                     Patient presents today for evaluation of a rash that initially started on the dorsum of her right wrist however now has lesions on her bilateral arms, legs, and left side of the face with scant swelling on the left lower eyelid. On exam it is difficult to assess the lesions due to secondary excoriation. Given the diffuse nature, lack of prodromal pain or symptoms, do not suspect zoster. The rash is itchy and she denies other possible new exposures, combined with the diffuse nature I suspect she may have a insect bite. We'll treat with antihistamines, p.o. steroids, Atarax as needed for itching. No evidence of f secondary bacterial infection at this time no airway compromise. We discussed appropriate use of EpiPen's as it does not sound like an EpiPen was warranted in this case as it was limited to skin.  Recommended she contact a exterminator to evaluate her home.  Return precautions were discussed  with patient who states their understanding.  At the time of discharge patient denied any unaddressed complaints or concerns.  Patient is agreeable for discharge home.  Note: Portions of this report may have been transcribed using voice recognition software. Every effort was made to ensure accuracy; however, inadvertent computerized transcription errors may be present.   Final Clinical Impression(s) / ED Diagnoses Final diagnoses:  Rash  Allergic reaction, initial encounter    Rx / DC Orders ED Discharge Orders         Ordered    predniSONE (DELTASONE) 20 MG tablet  Daily  11/11/19 1636    hydrOXYzine (ATARAX/VISTARIL) 25 MG tablet  Every 6 hours PRN     11/11/19 1636           Norman Clay 11/11/19 1646    Jacalyn Lefevre, MD 11/11/19 1757

## 2019-11-11 NOTE — ED Triage Notes (Signed)
Possible insect bite to her right hand. Wrist pain.

## 2019-12-09 ENCOUNTER — Other Ambulatory Visit: Payer: Self-pay

## 2019-12-09 ENCOUNTER — Encounter (HOSPITAL_BASED_OUTPATIENT_CLINIC_OR_DEPARTMENT_OTHER): Payer: Self-pay | Admitting: *Deleted

## 2019-12-09 DIAGNOSIS — L02511 Cutaneous abscess of right hand: Secondary | ICD-10-CM | POA: Insufficient documentation

## 2019-12-09 NOTE — ED Triage Notes (Signed)
Abscess to back of her right hand. Her hand is red and swollen.

## 2019-12-10 ENCOUNTER — Emergency Department (HOSPITAL_BASED_OUTPATIENT_CLINIC_OR_DEPARTMENT_OTHER)
Admission: EM | Admit: 2019-12-10 | Discharge: 2019-12-10 | Disposition: A | Discharge status: Home / Self Care | Payer: Self-pay | Attending: Emergency Medicine | Admitting: Emergency Medicine

## 2019-12-10 DIAGNOSIS — L02511 Cutaneous abscess of right hand: Secondary | ICD-10-CM

## 2019-12-10 LAB — PREGNANCY, URINE: Preg Test, Ur: NEGATIVE

## 2019-12-10 MED ORDER — LIDOCAINE-EPINEPHRINE (PF) 2 %-1:200000 IJ SOLN
INTRAMUSCULAR | Status: AC
  Administered 2019-12-10: 10 mL
  Filled 2019-12-10: qty 10

## 2019-12-10 MED ORDER — LIDOCAINE-EPINEPHRINE 2 %-1:100000 IJ SOLN: 20.0000 mL | Freq: Once | INTRAMUSCULAR | Status: DC

## 2019-12-10 MED ORDER — IBUPROFEN 800 MG PO TABS: 800.0000 mg | ORAL_TABLET | Freq: Three times a day (TID) | ORAL | 0 refills | Status: DC | PRN

## 2019-12-10 MED ORDER — DOXYCYCLINE HYCLATE 100 MG PO TABS
100.0000 mg | ORAL_TABLET | Freq: Once | ORAL | Status: AC
  Administered 2019-12-10: 100 mg via ORAL
  Filled 2019-12-10: qty 1

## 2019-12-10 MED ORDER — DOXYCYCLINE HYCLATE 100 MG PO CAPS: 100.0000 mg | ORAL_CAPSULE | Freq: Two times a day (BID) | ORAL | 0 refills | Status: DC

## 2019-12-10 MED ORDER — IBUPROFEN 800 MG PO TABS
800.0000 mg | ORAL_TABLET | Freq: Once | ORAL | Status: AC
  Administered 2019-12-10: 800 mg via ORAL
  Filled 2019-12-10: qty 1

## 2019-12-10 NOTE — ED Provider Notes (Addendum)
[facility] DEPT [facility] Provider Note: [name] Spurling, MD, FACEP  CSN: [id number] MRN: [id number] ARRIVAL: 12/09/19 at 2256 ROOM: [facility] [place]  Abscess   HISTORY OF PRESENT ILLNESS  12/10/19 3:13 AM [name] [name] is a 44 y.o. female with a tender, red, swollen area to the back of her right hand.  She rates associated pain is a 2 out of 10, worse with movement or palpation.  She states it started as a small skin bump and progressed over the past 4 days.  She has had some purulent drainage from it.  She denies any systemic symptoms such as fever or chills.   Past Medical History:  Diagnosis Date  . Abscess   . Shingles     Past Surgical History:  Procedure Laterality Date  . BUNIONECTOMY    . CHOLECYSTECTOMY    . ORIF TIBIA FRACTURE Right 07/13/2013   Procedure: OPEN REDUCTION INTERNAL FIXATION (ORIF) TIBIA FRACTURE;  Surgeon: Alta Corning, MD;  Location: [facility];  Service: Orthopedics;  Laterality: Right;  . TIBIA IM NAIL INSERTION Right 07/13/2013   Procedure: INTRAMEDULLARY (IM) NAIL TIBIAL;  Surgeon: Alta Corning, MD;  Location: [facility];  Service: Orthopedics;  Laterality: Right;  . TUBAL LIGATION      No family history on file.  Social History   Tobacco Use  . Smoking status: Never Smoker  . Smokeless tobacco: Never Used  Vaping Use  . Vaping Use: Never used  Substance Use Topics  . Alcohol use: Not Currently  . Drug use: No    Prior to Admission medications   Medication Sig Start Date End Date Taking? Authorizing Provider  amoxicillin-clavulanate (AUGMENTIN) 875-125 MG tablet Take 1 tablet by mouth every 12 (twelve) hours. 01/21/19   Carlisle Cater, PA-C  hydrOXYzine (ATARAX/VISTARIL) 25 MG tablet Take 1 tablet (25 mg total) by mouth every 6 (six) hours as needed for itching. 11/11/19   Lorin Glass, PA-C    Allergies Monistat [miconazole]   REVIEW OF SYSTEMS  Negative except as noted here or in the History of Present  Illness.   PHYSICAL EXAMINATION  Initial Vital Signs Blood pressure 130/89, pulse 84, temperature 98.2 F (36.8 C), temperature source Oral, resp. rate 17, height 5\' 5"  (1.651 m), weight 113.4 kg, last menstrual period 11/25/2019, SpO2 100 %.  Examination General: Well-developed, well-nourished female in no acute distress; appearance consistent with age of record HENT: normocephalic; atraumatic Eyes: Normal appearance Neck: supple Heart: regular rate and rhythm Lungs: clear to auscultation bilaterally Abdomen: soft; nondistended; nontender; bowel sounds present Extremities: No deformity; full range of motion; pulses normal Neurologic: Awake, alert and oriented; motor function intact in all extremities and symmetric; no facial droop Skin: Warm and dry; abscess dorsal right hand:    Psychiatric: Normal mood and affect   RESULTS  Summary of this visit's results, reviewed and interpreted by myself:   EKG Interpretation  Date/Time:    Ventricular Rate:    PR Interval:    QRS Duration:   QT Interval:    QTC Calculation:   R Axis:     Text Interpretation:        Laboratory Studies: Results for orders placed or performed during the hospital encounter of 12/10/19 (from the past 24 hour(s))  Pregnancy, urine     Status: None   Collection Time: 12/10/19  3:30 AM  Result Value Ref Range   Preg Test, Ur NEGATIVE NEGATIVE   Imaging Studies: No results found.  ED COURSE and MDM  Nursing notes, initial and subsequent vitals signs, including pulse oximetry, reviewed and interpreted by myself.  Vitals:   12/09/19 2304 12/09/19 2307 12/10/19 0222  BP:  (!) 125/93 130/89  Pulse:  70 84  Resp:  20 17  Temp:  98.2 F (36.8 C)   TempSrc:  Oral   SpO2:  96% 100%  Weight: 113.4 kg    Height: 5\' 5"  (1.651 m)     Medications  lidocaine-EPINEPHrine (XYLOCAINE W/EPI) 2 %-1:100000 (with pres) injection 20 mL (has no administration in time range)  lidocaine-EPINEPHrine (XYLOCAINE  W/EPI) 2 %-1:100000 (with pres) injection 20 mL (has no administration in time range)  doxycycline (VIBRA-TABS) tablet 100 mg (has no administration in time range)  lidocaine-EPINEPHrine (XYLOCAINE W/EPI) 2 %-1:200000 (PF) injection (10 mLs  Given by Other 12/10/19 0324)    Patient has multiple small "bumps" on her right forearm and hand she is concerned that these may become infected.  We will place her on doxycycline for a week.  Pus from abscess was sent for culture.  PROCEDURES  Procedures  INCISION AND DRAINAGE Performed by: 12/12/19 Fin Hupp Consent: Verbal consent obtained. Risks and benefits: risks, benefits and alternatives were discussed Type: abscess  Body area: Right hand  Anesthesia: local infiltration  Incision was made with a scalpel.  Abscess does not appear to involve deep structures such as the tendons.  Local anesthetic: lidocaine 2% with epinephrine  Anesthetic total: 3 ml  Complexity: complex Blunt dissection to break up loculations  Drainage: purulent  Drainage amount: Moderate  Packing material: None  Patient tolerance: Patient tolerated the procedure well with no immediate complications.   ED DIAGNOSES     ICD-10-CM   1. Abscess of right hand  L02.511        Eular Panek, Carlisle Beers, MD 12/10/19 12/12/19    [id number], MD 12/10/19 [id number]

## 2019-12-15 LAB — AEROBIC/ANAEROBIC CULTURE W GRAM STAIN (SURGICAL/DEEP WOUND)

## 2019-12-17 ENCOUNTER — Telehealth: Payer: Self-pay | Admitting: *Deleted

## 2019-12-17 NOTE — Telephone Encounter (Signed)
Post ED Visit - Positive Culture Follow-up  Culture report reviewed by antimicrobial stewardship pharmacist: [facility] []  , Pharm.D. []  Enzo Bi, Pharm.D., BCPS AQ-ID []  , Pharm.D., BCPS []  Celedonio Miyamoto, Pharm.D., BCPS []  [place], Garvin Fila.D., BCPS, AAHIVP []  , Pharm.D., BCPS, AAHIVP []  Georgina Pillion, PharmD, BCPS []  , PharmD, BCPS []  [place], PharmD, BCPS []  [address], PharmD []  , PharmD, BCPS []  Estella Husk, PharmD  Pharmacy Team []  Lysle Pearl, PharmD []  , PharmD []  Phillips Climes, PharmD []  , Rph []  Agapito Games) , PharmD []  Verlan Friends, PharmD []  , PharmD []  Mervyn Gay, PharmD []  , PharmD []  Vinnie Level, PharmD []  [facility], PharmD []  , PharmD []  Len Childs, PharmD   Positive wound culture, reviewed by , PharmD Treated with Doxycycline, organism sensitive to the same and no further patient follow-up is required at this time.  Greer Pickerel [facility] 12/17/2019, 1:03 PM

## 2021-03-14 ENCOUNTER — Telehealth: Payer: Self-pay

## 2021-03-14 NOTE — Telephone Encounter (Signed)
Copied from CRM [id number]. Topic: Appointment Scheduling - Scheduling Inquiry for Clinic >> Mar 13, 2021  1:14 PM Randol Kern wrote: Pt wants to establish with the office  Best contact: [phone]  Left patient vm to call [phone] to schedule new patient appt.

## 2021-05-22 ENCOUNTER — Encounter: Payer: Self-pay | Admitting: Internal Medicine

## 2021-05-22 ENCOUNTER — Ambulatory Visit: Payer: PRIVATE HEALTH INSURANCE | Attending: Internal Medicine | Admitting: Internal Medicine

## 2021-05-22 ENCOUNTER — Other Ambulatory Visit: Payer: Self-pay

## 2021-05-22 VITALS — BP 120/84 | HR 68 | Resp 16 | Ht 66.0 in | Wt 253.6 lb

## 2021-05-22 DIAGNOSIS — Z7689 Persons encountering health services in other specified circumstances: Secondary | ICD-10-CM

## 2021-05-22 DIAGNOSIS — R030 Elevated blood-pressure reading, without diagnosis of hypertension: Secondary | ICD-10-CM

## 2021-05-22 DIAGNOSIS — M7660 Achilles tendinitis, unspecified leg: Secondary | ICD-10-CM

## 2021-05-22 DIAGNOSIS — Z1159 Encounter for screening for other viral diseases: Secondary | ICD-10-CM

## 2021-05-22 DIAGNOSIS — Z6841 Body Mass Index (BMI) 40.0 and over, adult: Secondary | ICD-10-CM

## 2021-05-22 DIAGNOSIS — Z114 Encounter for screening for human immunodeficiency virus [HIV]: Secondary | ICD-10-CM

## 2021-05-22 DIAGNOSIS — E559 Vitamin D deficiency, unspecified: Secondary | ICD-10-CM

## 2021-05-22 DIAGNOSIS — Z2821 Immunization not carried out because of patient refusal: Secondary | ICD-10-CM

## 2021-05-22 MED ORDER — MELOXICAM 15 MG PO TABS: 15.0000 mg | ORAL_TABLET | Freq: Every day | ORAL | 0 refills | Status: DC

## 2021-05-22 NOTE — Progress Notes (Signed)
Patient ID: [name] [name], female    DOB: 04/02/1976  MRN: [id number]  CC: New Patient (Initial Visit) and Foot Pain (Left/Heel spur)   Subjective: [name] [name] is a 45 y.o. female who presents for new pt visit Her concerns today include:   Previous PCP was at Dr. Laurence Aly at [facility].  Last seen around 2017. Changed due to loss of insurance but just got insurance coverage again.  She denies any chronic medical issues other than being treated in the past for bursitis in the hips.  She tells me that she also has chronic pain in the right lower leg.  She has a steel rod in the leg.  She was prescribed tramadol in the past.  Now she takes ibuprofen and Tylenol as needed.  Also gives history of vitamin D deficiency and would like to have level checked.  Request baseline blood test  BP noted to be elev.  No past hx of HTN Does not use a lot of salt with foods but used it on her grades twice in the past week.  No headaches or dizziness.  No lower extremity swelling.  Obesity:  trying to eat better and lose wgh.  She stopped drinking sodas.  Still drinks sweet tea and Red Bull 1 can a day.  She was 162 lbs 3 mths ago "I don't know how to eat." Reports her activity level is limited due to pain in RT leg from steel rod and pain LT foot.  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  Does have an elliptical at home but has not used it in a while.  Pain LT foot x 1 yr. Worse in last 6 mths.  Pain occurs at rest and with activity.  It is mainly located in the posterior heel along the lower edge of the Achilles tendon.  Seen by sports medicine at [facility] in October of last year.  X-ray revealed large calcaneal plantar and posterior enthesophyte and thickening of the distal Achilles tendon. Conservative measures were recommended.  She was provided with a heel lift.  The orthopedic showed her to do stretching exercises.  MRI of the heel was ordered but patient never had this  done.  Patient Active Problem List   Diagnosis Date Noted   Fracture of tibia with fibula, right, closed 07/13/2013     No current outpatient medications on file prior to visit.   No current facility-administered medications on file prior to visit.    Allergies  Allergen Reactions   Monistat [Miconazole] Itching    Social History   Socioeconomic History   Marital status: Single    Spouse name: Not on file   Number of children: Not on file   Years of education: Not on file   Highest education level: Not on file  Occupational History   Not on file  Tobacco Use   Smoking status: Never   Smokeless tobacco: Never  Vaping Use   Vaping Use: Never used  Substance and Sexual Activity   Alcohol use: Yes    Comment: occasionally   Drug use: No   Sexual activity: Not on file  Other Topics Concern   Not on file  Social History Narrative   Not on file   Social Determinants of Health   Financial Resource Strain: Not on file  Food Insecurity: Not on file  Transportation Needs: Not on file  Physical Activity: Not on file  Stress: Not on file  Social Connections: Not on file  Intimate Partner Violence: Not on file    Family History  Problem Relation Age of Onset   Hypertension Mother    Diabetes Maternal Grandmother     Past Surgical History:  Procedure Laterality Date   BUNIONECTOMY     CHOLECYSTECTOMY     ORIF TIBIA FRACTURE Right 07/13/2013   Procedure: OPEN REDUCTION INTERNAL FIXATION (ORIF) TIBIA FRACTURE;  Surgeon: Harvie Junior, MD;  Location: [facility] OR;  Service: Orthopedics;  Laterality: Right;   TIBIA IM NAIL INSERTION Right 07/13/2013   Procedure: INTRAMEDULLARY (IM) NAIL TIBIAL;  Surgeon: Harvie Junior, MD;  Location: [facility] OR;  Service: Orthopedics;  Laterality: Right;   TUBAL LIGATION      ROS: Review of Systems Negative except as stated above  PHYSICAL EXAM: BP 120/84   Pulse 68   Resp 16   Ht 5\' 6"  (1.676 m)   Wt 253 lb 9.6 oz (115 kg)   SpO2 98%    BMI 40.93 kg/m   Physical Exam  General appearance - alert, well appearing, obese middle-age Caucasian female and in no distress Mental status - normal mood, behavior, speech, dress, motor activity, and thought processes Nose - normal and patent, no erythema, discharge or polyps Mouth - mucous membranes moist, pharynx normal without lesions Neck - supple, no significant adenopathy Chest - clear to auscultation, no wheezes, rales or rhonchi, symmetric air entry Heart - normal rate, regular rhythm, normal S1, S2, no murmurs, rubs, clicks or gallops Extremities - peripheral pulses normal, no pedal edema, no clubbing or cyanosis MSK: Left foot -patient with mild swelling noted over the posterior heel.  She is tender at the insertion site of the Achilles tendon onto the calcaneal bone  CMP Latest Ref Rng & Units 11/19/2011  Glucose 70 - 99 mg/dL 01/19/2012)  BUN 6 - 23 mg/dL 14  Creatinine 093(A - 3.55 mg/dL 7.32  Sodium 2.02 - 542 mEq/L 137  Potassium 3.5 - 5.1 mEq/L 3.6  Chloride 96 - 112 mEq/L 103  CO2 19 - 32 mEq/L 27  Calcium 8.4 - 10.5 mg/dL 9.5  Total Protein 6.0 - 8.3 g/dL 7.7  Total Bilirubin 0.3 - 1.2 mg/dL 706)  Alkaline Phos 39 - 117 U/L 64  AST 0 - 37 U/L 23  ALT 0 - 35 U/L 29   Lipid Panel  No results found for: CHOL, TRIG, HDL, CHOLHDL, VLDL, LDLCALC, LDLDIRECT  CBC    Component Value Date/Time   WBC 14.5 (H) 07/13/2013 1322   RBC 4.40 07/13/2013 1322   HGB 13.4 07/13/2013 1322   HCT 40.0 07/13/2013 1322   PLT 316 07/13/2013 1322   MCV 90.9 07/13/2013 1322   MCH 30.5 07/13/2013 1322   MCHC 33.5 07/13/2013 1322   RDW 13.3 07/13/2013 1322   LYMPHSABS 4.0 07/13/2013 1322   MONOABS 0.9 07/13/2013 1322   EOSABS 0.4 07/13/2013 1322   BASOSABS 0.2 (H) 07/13/2013 1322    ASSESSMENT AND PLAN: 1. Encounter to establish care Patient to sign a release for 07/15/2013 to get her records from [facility].  2. Morbid obesity with body mass index (BMI) of 40.0 to 49.9  (HCC) Dietary counseling given.  Advised to cut back on portion sizes especially of white carbohydrates, eliminate sugary drinks from the diet and drink more water, eat more white lean meat instead of red meat and incorporate fresh fruits and vegetables into the diet daily.  I informed her  of our medical weight management program versus referral to nutritionist for sitdown one-on-one counseling.  Patient would like referral to both. Encouraged her to try to get in some form of moderate intensity exercise such as brisk walking, swimming or riding a bike.  She has an elliptical at home.  She will start using that.  I informed her to start low and go slow but the goal is to get up to about 150 minutes/week - Hemoglobin A1c - CBC - Comprehensive metabolic panel - Lipid panel - Amb Ref to Medical Weight Management - Amb ref to Medical Nutrition Therapy-MNT  3. Insertional Achilles tendinopathy - Ambulatory referral to Podiatry - meloxicam (MOBIC) 15 MG tablet; Take 1 tablet (15 mg total) by mouth daily.  Dispense: 30 tablet; Refill: 0  4. Vitamin D deficiency - VITAMIN D 25 Hydroxy (Vit-D Deficiency, Fractures)  5. Elevated blood-pressure reading without diagnosis of hypertension DASH diet discussed and encouraged.  6. Screening for HIV (human immunodeficiency virus) Patient agreeable to screening. - HIV Antibody (routine testing w rflx)  7. Need for hepatitis C screening test Patient agreeable to screening. - HCV Ab w Reflex to Quant PCR  8. Influenza vaccination declined Recommended.  Patient declined.    Patient was given the opportunity to ask questions.  Patient verbalized understanding of the plan and was able to repeat key elements of the plan.   Orders Placed This Encounter  Procedures   HIV Antibody (routine testing w rflx)   HCV Ab w Reflex to Quant PCR   Hemoglobin A1c   CBC   Comprehensive metabolic panel   Lipid panel   VITAMIN D 25 Hydroxy (Vit-D Deficiency,  Fractures)   Ambulatory referral to Podiatry   Amb Ref to Medical Weight Management   Amb ref to Medical Nutrition Therapy-MNT     Requested Prescriptions   Signed Prescriptions Disp Refills   meloxicam (MOBIC) 15 MG tablet 30 tablet 0    Sig: Take 1 tablet (15 mg total) by mouth daily.    Return in about 7 weeks (around 07/10/2021) for f/u for PAP.  Sign a rlease to get records from previous PCP.  Jonah Blue, MD, FACP

## 2021-05-22 NOTE — Patient Instructions (Signed)

## 2021-05-23 ENCOUNTER — Encounter: Payer: Self-pay | Admitting: Internal Medicine

## 2021-05-23 ENCOUNTER — Telehealth: Payer: Self-pay

## 2021-05-23 DIAGNOSIS — Z862 Personal history of diseases of the blood and blood-forming organs and certain disorders involving the immune mechanism: Secondary | ICD-10-CM | POA: Insufficient documentation

## 2021-05-23 DIAGNOSIS — R7303 Prediabetes: Secondary | ICD-10-CM | POA: Insufficient documentation

## 2021-05-23 LAB — COMPREHENSIVE METABOLIC PANEL
ALT: 32 IU/L (ref 0–32)
AST: 28 IU/L (ref 0–40)
Albumin/Globulin Ratio: 1.6 (ref 1.2–2.2)
Albumin: 4.6 g/dL (ref 3.8–4.8)
Alkaline Phosphatase: 77 IU/L (ref 44–121)
BUN/Creatinine Ratio: 16 (ref 9–23)
BUN: 13 mg/dL (ref 6–24)
Bilirubin Total: 0.4 mg/dL (ref 0.0–1.2)
CO2: 24 mmol/L (ref 20–29)
Calcium: 9.4 mg/dL (ref 8.7–10.2)
Chloride: 103 mmol/L (ref 96–106)
Creatinine, Ser: 0.79 mg/dL (ref 0.57–1.00)
Globulin, Total: 2.8 g/dL (ref 1.5–4.5)
Glucose: 106 mg/dL — ABNORMAL HIGH (ref 70–99)
Potassium: 4.4 mmol/L (ref 3.5–5.2)
Sodium: 141 mmol/L (ref 134–144)
Total Protein: 7.4 g/dL (ref 6.0–8.5)
eGFR: 93 mL/min/{1.73_m2} (ref >59)

## 2021-05-23 LAB — CBC
Hematocrit: 41.3 % (ref 34.0–46.6)
Hemoglobin: 13.5 g/dL (ref 11.1–15.9)
MCH: 29.2 pg (ref 26.6–33.0)
MCHC: 32.7 g/dL (ref 31.5–35.7)
MCV: 89 fL (ref 79–97)
Platelets: 421 10*3/uL (ref 150–450)
RBC: 4.62 x10E6/uL (ref 3.77–5.28)
RDW: 12.5 % (ref 11.7–15.4)
WBC: 14 10*3/uL — ABNORMAL HIGH (ref 3.4–10.8)

## 2021-05-23 LAB — LIPID PANEL
Chol/HDL Ratio: 3.8 ratio (ref 0.0–4.4)
Cholesterol, Total: 167 mg/dL (ref 100–199)
HDL: 44 mg/dL (ref >39)
LDL Chol Calc (NIH): 95 mg/dL (ref 0–99)
Triglycerides: 163 mg/dL — ABNORMAL HIGH (ref 0–149)
VLDL Cholesterol Cal: 28 mg/dL (ref 5–40)

## 2021-05-23 LAB — HIV ANTIBODY (ROUTINE TESTING W REFLEX): HIV Screen 4th Generation wRfx: NONREACTIVE

## 2021-05-23 LAB — VITAMIN D 25 HYDROXY (VIT D DEFICIENCY, FRACTURES): Vit D, 25-Hydroxy: 28.1 ng/mL — ABNORMAL LOW (ref 30.0–100.0)

## 2021-05-23 LAB — HCV INTERPRETATION

## 2021-05-23 LAB — HEMOGLOBIN A1C
Est. average glucose Bld gHb Est-mCnc: 120 mg/dL
Hgb A1c MFr Bld: 5.8 % — ABNORMAL HIGH (ref 4.8–5.6)

## 2021-05-23 LAB — HCV AB W REFLEX TO QUANT PCR: HCV Ab: 0.1 s/co ratio (ref 0.0–0.9)

## 2021-05-23 NOTE — Progress Notes (Signed)
HIV screen negative. She has prediabetes.  Healthy eating habits and regular exercise as discussed on recent visit will help prevent progression to full diabetes. Mild elevation in white blood cell count which is stable compared to 7 years ago.  We will continue to monitor this over time.  Kidney and liver function test normal. -Cholesterol levels are good. Vitamin D level mildly low.  She can purchase vitamin D 400 IU over-the-counter and take 1 daily.

## 2021-05-23 NOTE — Telephone Encounter (Signed)
Contacted pt to go over lab results pt is aware and doesn't have any questions or concerns 

## 2021-05-24 ENCOUNTER — Ambulatory Visit (INDEPENDENT_AMBULATORY_CARE_PROVIDER_SITE_OTHER): Payer: Self-pay

## 2021-05-24 ENCOUNTER — Encounter: Payer: Self-pay | Admitting: Podiatry

## 2021-05-24 ENCOUNTER — Ambulatory Visit (INDEPENDENT_AMBULATORY_CARE_PROVIDER_SITE_OTHER): Payer: PRIVATE HEALTH INSURANCE | Admitting: Podiatry

## 2021-05-24 ENCOUNTER — Other Ambulatory Visit: Payer: Self-pay

## 2021-05-24 DIAGNOSIS — M79671 Pain in right foot: Secondary | ICD-10-CM

## 2021-05-24 DIAGNOSIS — M7662 Achilles tendinitis, left leg: Secondary | ICD-10-CM

## 2021-05-24 NOTE — Progress Notes (Signed)
  Subjective:  Patient ID: [name] [name], female    DOB: 03/11/1976,   MRN: [id number]  Chief Complaint  Patient presents with   Foot Pain    Left foot - Insertional Achilles tendinopathy  / right foot & ankle swelling no pain    45 y.o. female presents for concern of bilateral heel pain that has been present for about 3 years. States she saw a physician a year ago and was told she had "strained" her achilles. Relates the left primarily hurts and is constant. Also relates a cyst on the left side. Relates on the right she does not have as much pain but will get swelling and has a history of bunion surgery and a rod in her leg.   . Denies any other pedal complaints. Denies n/v/f/c.   Past Medical History:  Diagnosis Date   Abscess    Shingles     Objective:  Physical Exam: Vascular: DP/PT pulses 2/4 bilateral. CFT <3 seconds. Normal hair growth on digits. No edema.  Skin. No lacerations or abrasions bilateral feet.  Musculoskeletal: MMT 5/5 bilateral lower extremities in DF, PF, Inversion and Eversion. Deceased ROM in DF of ankle joint. Tender to achilles insertion along areas of spurring. No pain proximally up the tendon. No pain to plantar heel. No pain along PT or peroneal tendons.  Neurological: Sensation intact to light touch.   Assessment:   1. Tendonitis, Achilles, left      Plan:  Patient was evaluated and treated and all questions answered. -Xrays reviewed. Spurring noted to posterior and plantar calcaneus bilateral. Haglund prominence noted bilateral. Retained hardware noted in the right first metatarsal from previous bunion and tibia. -Discussed Achilles insertional tendonitis and treatment options with patient.  -Discussed stretching exercises. -Patient has meloxicam which she has not taken and will start taking.  -Heel lifts provided and discussed proper shoewear.  -Discussed if no improvement will consider PT/EPAT/PRP injections or surgery.  -MRI ordered of left  foot.  -Patient to return to office as needed or sooner if condition worsens.   Louann Sjogren, DPM

## 2021-05-24 NOTE — Patient Instructions (Signed)

## 2021-05-30 ENCOUNTER — Encounter: Payer: Self-pay | Admitting: Internal Medicine

## 2021-05-30 NOTE — Progress Notes (Signed)
Received patient's previous records from [facility]. Patient with diagnosis of hyperlipidemia, lower extremity edema, chronic pain, fatty liver, vitamin D deficiency, undiagnosed murmur. Bone density study 03/23/2015 normal Pap 03/23/2015 X-ray of the lumbar spine done 10/25/2014 showed mild disc space narrowing at L2-L5 with osteophytes EKG done 03/23/2015 normal Spirometry 02/02/2014 moderate obstruction. I did not see an immunization record.

## 2021-07-05 ENCOUNTER — Ambulatory Visit (INDEPENDENT_AMBULATORY_CARE_PROVIDER_SITE_OTHER): Payer: Self-pay | Admitting: Podiatry

## 2021-07-05 DIAGNOSIS — Z91199 Patient's noncompliance with other medical treatment and regimen due to unspecified reason: Secondary | ICD-10-CM

## 2021-07-05 NOTE — Progress Notes (Signed)
No show

## 2021-07-10 ENCOUNTER — Ambulatory Visit: Payer: PRIVATE HEALTH INSURANCE | Admitting: Registered"

## 2021-07-19 ENCOUNTER — Ambulatory Visit: Payer: PRIVATE HEALTH INSURANCE | Admitting: Internal Medicine

## 2021-07-20 ENCOUNTER — Other Ambulatory Visit: Payer: Self-pay | Admitting: Internal Medicine

## 2021-07-20 DIAGNOSIS — M7660 Achilles tendinitis, unspecified leg: Secondary | ICD-10-CM

## 2021-07-20 NOTE — Telephone Encounter (Signed)
Requested Prescriptions  Pending Prescriptions Disp Refills   meloxicam (MOBIC) 15 MG tablet [Pharmacy Med Name: MELOXICAM 15MG TABLETS] 30 tablet 0    Sig: TAKE 1 TABLET(15 MG) BY MOUTH DAILY     Analgesics:  COX2 Inhibitors Failed - 07/20/2021  5:04 PM      Failed - Manual Review: Labs are only required if the patient has taken medication for more than 8 weeks.      Passed - HGB in normal range and within 360 days    Hemoglobin  Date Value Ref Range Status  05/22/2021 13.5 11.1 - 15.9 g/dL Final         Passed - Cr in normal range and within 360 days    Creatinine, Ser  Date Value Ref Range Status  05/22/2021 0.79 0.57 - 1.00 mg/dL Final         Passed - HCT in normal range and within 360 days    Hematocrit  Date Value Ref Range Status  05/22/2021 41.3 34.0 - 46.6 % Final         Passed - AST in normal range and within 360 days    AST  Date Value Ref Range Status  05/22/2021 28 0 - 40 IU/L Final         Passed - ALT in normal range and within 360 days    ALT  Date Value Ref Range Status  05/22/2021 32 0 - 32 IU/L Final         Passed - eGFR is 30 or above and within 360 days    GFR calc Af Amer  Date Value Ref Range Status  11/19/2011 >90 >90 mL/min Final    Comment:           The eGFR has been calculated using the CKD EPI equation. This calculation has not been validated in all clinical situations. eGFR's persistently <90 mL/min signify possible Chronic Kidney Disease.   GFR calc non Af Amer  Date Value Ref Range Status  11/19/2011 >90 >90 mL/min Final   eGFR  Date Value Ref Range Status  05/22/2021 93 >59 mL/min/1.73 Final         Passed - Patient is not pregnant      Passed - Valid encounter within last 12 months    Recent Outpatient Visits          1 month ago Encounter to establish care   [facility] Ladell Pier, MD
